# Patient Record
Sex: Male | Born: 1960 | Race: White | Hispanic: No | Marital: Married | Smoking: Current every day smoker
Health system: Southern US, Community
[De-identification: ages and names within clinical notes are randomized; demographics above are authoritative.]

## PROBLEM LIST (undated history)

## (undated) DIAGNOSIS — F419 Anxiety disorder, unspecified: Secondary | ICD-10-CM

## (undated) DIAGNOSIS — F329 Major depressive disorder, single episode, unspecified: Secondary | ICD-10-CM

## (undated) DIAGNOSIS — I10 Essential (primary) hypertension: Secondary | ICD-10-CM

## (undated) DIAGNOSIS — F32A Depression, unspecified: Secondary | ICD-10-CM

## (undated) HISTORY — PX: OTHER SURGICAL HISTORY: SHX169

## (undated) HISTORY — PX: TONSILLECTOMY: SUR1361

## (undated) HISTORY — PX: ABDOMINAL SURGERY: SHX537

## (undated) HISTORY — PX: SHOULDER FUSION SURGERY: SHX775

---

## 2001-03-05 ENCOUNTER — Emergency Department (HOSPITAL_COMMUNITY): Admission: EM | Admit: 2001-03-05 | Discharge: 2001-03-05 | Payer: Self-pay

## 2010-02-02 ENCOUNTER — Inpatient Hospital Stay (HOSPITAL_COMMUNITY): Admission: EM | Admit: 2010-02-02 | Discharge: 2010-02-08 | Payer: Self-pay | Admitting: Emergency Medicine

## 2010-03-15 ENCOUNTER — Encounter: Admission: RE | Admit: 2010-03-15 | Discharge: 2010-03-15 | Payer: Self-pay | Admitting: Surgery

## 2010-05-04 ENCOUNTER — Encounter (INDEPENDENT_AMBULATORY_CARE_PROVIDER_SITE_OTHER): Payer: Self-pay | Admitting: *Deleted

## 2010-05-05 ENCOUNTER — Ambulatory Visit: Payer: Self-pay | Admitting: Internal Medicine

## 2010-05-05 ENCOUNTER — Inpatient Hospital Stay (HOSPITAL_COMMUNITY)
Admission: EM | Admit: 2010-05-05 | Discharge: 2010-05-16 | Payer: Self-pay | Source: Home / Self Care | Admitting: Emergency Medicine

## 2010-05-07 ENCOUNTER — Encounter: Payer: Self-pay | Admitting: Internal Medicine

## 2010-05-27 ENCOUNTER — Encounter: Payer: Self-pay | Admitting: Internal Medicine

## 2010-05-30 ENCOUNTER — Telehealth: Payer: Self-pay | Admitting: Internal Medicine

## 2010-06-07 ENCOUNTER — Telehealth: Payer: Self-pay | Admitting: Internal Medicine

## 2010-06-07 DIAGNOSIS — Z8711 Personal history of peptic ulcer disease: Secondary | ICD-10-CM | POA: Insufficient documentation

## 2010-06-07 DIAGNOSIS — R1013 Epigastric pain: Secondary | ICD-10-CM

## 2010-06-08 ENCOUNTER — Encounter: Payer: Self-pay | Admitting: Internal Medicine

## 2010-06-17 ENCOUNTER — Encounter: Payer: Self-pay | Admitting: Internal Medicine

## 2010-06-17 ENCOUNTER — Ambulatory Visit (HOSPITAL_COMMUNITY)
Admission: RE | Admit: 2010-06-17 | Discharge: 2010-06-17 | Payer: Self-pay | Source: Home / Self Care | Admitting: Internal Medicine

## 2010-06-23 ENCOUNTER — Telehealth: Payer: Self-pay | Admitting: Internal Medicine

## 2010-06-23 ENCOUNTER — Encounter: Payer: Self-pay | Admitting: Internal Medicine

## 2010-06-27 ENCOUNTER — Ambulatory Visit: Payer: Self-pay | Admitting: Cardiology

## 2010-06-27 ENCOUNTER — Telehealth: Payer: Self-pay | Admitting: Internal Medicine

## 2010-07-15 ENCOUNTER — Encounter: Payer: Self-pay | Admitting: Internal Medicine

## 2010-08-10 LAB — CBC
Hemoglobin: 16.8 g/dL (ref 13.0–17.0)
MCV: 89.9 fL (ref 78.0–100.0)
RDW: 12.7 % (ref 11.5–15.5)
WBC: 12.2 10*3/uL — ABNORMAL HIGH (ref 4.0–10.5)

## 2010-08-10 LAB — BASIC METABOLIC PANEL
CO2: 26 mEq/L (ref 19–32)
Chloride: 104 mEq/L (ref 96–112)
Creatinine, Ser: 0.75 mg/dL (ref 0.4–1.5)
GFR calc Af Amer: >60 mL/min (ref >60)

## 2010-08-10 LAB — SURGICAL PCR SCREEN
MRSA, PCR: NEGATIVE
Staphylococcus aureus: NEGATIVE

## 2010-08-12 ENCOUNTER — Inpatient Hospital Stay (HOSPITAL_COMMUNITY)
Admission: RE | Admit: 2010-08-12 | Discharge: 2010-08-13 | Payer: Self-pay | Source: Home / Self Care | Attending: Surgery | Admitting: Surgery

## 2010-08-12 NOTE — Op Note (Signed)
NAME:  Ronald Robertson, Ronald Robertson NO.:  [id number]  MEDICAL RECORD NO.:  [id number]          PATIENT TYPE:  INP  LOCATION:  0001                         FACILITY:  [facility]  PHYSICIAN:  Thornton Park. Daphine Deutscher, MD  DATE OF BIRTH:  May 23, 1961  DATE OF PROCEDURE:  08/12/2010 DATE OF DISCHARGE:                              OPERATIVE REPORT   PREOPERATIVE INDICATIONS:  Ronald Robertson is a 50 year old white male who underwent a laparotomy and Cheree Ditto patch closure of a perforated pyloric channel ulcer on February 03, 2010.  He went home after a 6-day hospital stay and initially did reasonably well, but then began having some worsening abdominal pain with periodic diarrhea, weight loss, and fatigue.  He was admitted in May 07, 2010,  and kept in the hospital for a number of days and an inflammatory mass noted in the midepigastrium in the right upper quadrant.  He continued to hurt, and we did CT.  There was nothing that felt like they could stick, but he had very localized deep pain, so we talked about laparoscopy and possible laparotomy with partial gastrectomy, if need be.  PROCEDURE:  Laparoscopy and takedown of a walled off small intra- abdominal abscess in the right upper quadrant involving the falciform ligament, the omental patch, and the fundus of the gallbladder.  Removal of some silk suture foreign body material which was a part of the abscess and culture of this material.  SURGEON:  Luretha Murphy, M.D.  ASSISTANT:  Ovidio Kin, M.D.  ANESTHESIA:  General endotracheal.  DESCRIPTION OF PROCEDURE:  The patient was taken to room 1 and after time-out, the abdomen which had been prepped with PCMX was entered in the left upper quadrant using 5 mm Optiview 0 degree without difficulty. Abdomen was insufflated.  A second 5 mm was placed in a lower abdomen and through that, I took a pair of the harmonic scalpel and took down adhesions to the anterior abdominal wall.  This freed up  his upper midline incision.  When that was completed, I put another trocar on the right side and began up there where we had marked his epicenter of pain. Medially beneath that, it became apparent that his gallbladder the fundus was drawn down into this abscess cavity which was beneath falciform and involved the omentum.  I went ahead and teased that away and eventually cut that away.  I was able to do that without entering the gallbladder.  The gallbladder itself did not appear to be involved primarily in this and we elected to not remove it, but to leave it in place.  We however kind of entered in this little friable area, and I removed 2 silk sutures that had been used to tie down the omentum in this [place] patch.  Area was cultured and irrigated.  No other things were noted.  The omental patch itself was firmly adherent to the prepyloric region and Dr. Leone Payor had actually done an endoscopy and said that all looked okay on the inside.  We elected to irrigate and then inject the 3 trocar sites with Marcaine and come out.  I think we  had freed him up from the nidus of his pain.  The patient was taken to recovery room in satisfactory condition.  He will be admitted for observation.     Thornton Park Daphine Deutscher, MD     MBM/MEDQ  D:  08/12/2010  T:  08/12/2010  Job:  [id number]  Electronically Signed by Luretha Murphy MD on 08/12/2010 08:32:13 PM

## 2010-08-13 LAB — BASIC METABOLIC PANEL
BUN: 6 mg/dL (ref 6–23)
Calcium: 9.1 mg/dL (ref 8.4–10.5)
Chloride: 106 mEq/L (ref 96–112)
Creatinine, Ser: 0.6 mg/dL (ref 0.4–1.5)
Glucose, Bld: 139 mg/dL — ABNORMAL HIGH (ref 70–99)
Potassium: 4.1 mEq/L (ref 3.5–5.1)
Sodium: 140 mEq/L (ref 135–145)

## 2010-08-13 LAB — CBC
HCT: 42.8 % (ref 39.0–52.0)
MCH: 31.3 pg (ref 26.0–34.0)
MCHC: 35 g/dL (ref 30.0–36.0)
MCV: 89.2 fL (ref 78.0–100.0)
RDW: 12.4 % (ref 11.5–15.5)

## 2010-08-15 LAB — CULTURE, ROUTINE-ABSCESS

## 2010-08-16 NOTE — Letter (Signed)
Summary: New Patient letter  [facility]  [address] [place], [place] [address]   Phone: [phone]  Fax: [phone]       05/04/2010 MRN: [id number]  [facility] [address] [place], [place]  [address]  Dear Ronald Robertson,  Welcome to the Gastroenterology Division at [facility].    You are scheduled to see Dr.  Russella Dar on 06-13-10 at 3:00p.m. on the 3rd floor at [facility], [address].  We ask that you try to arrive at our office 15 minutes prior to your appointment time to allow for check-in.  We would like you to complete the enclosed self-administered evaluation form prior to your visit and bring it with you on the day of your appointment.  We will review it with you.  Also, please bring a complete list of all your medications or, if you prefer, bring the medication bottles and we will list them.  Please bring your insurance card so that we may make a copy of it.  If your insurance requires a referral to see a specialist, please bring your referral form from your primary care physician.  Co-payments are due at the time of your visit and may be paid by cash, check or credit card.     Your office visit will consist of a consult with your physician (includes a physical exam), any laboratory testing he/she may order, scheduling of any necessary diagnostic testing (e.g. x-ray, ultrasound, CT-scan), and scheduling of a procedure (e.g. Endoscopy, Colonoscopy) if required.  Please allow enough time on your schedule to allow for any/all of these possibilities.    If you cannot keep your appointment, please call [phone] to cancel or reschedule prior to your appointment date.  This allows [place] the opportunity to schedule an appointment for another patient in need of care.  If you do not cancel or reschedule by 5 p.m. the business day prior to your appointment date, you will be charged a $50.00 late cancellation/no-show fee.    Thank you for choosing  [facility] for your medical needs.  We appreciate the opportunity to care for you.  Please visit [place] at our website  to learn more about our practice.                     Sincerely,                                                             The Gastroenterology Division

## 2010-08-16 NOTE — Miscellaneous (Signed)
  Clinical Lists Changes  Medications: Added new medication of VICODIN 5-500 MG TABS (HYDROCODONE-ACETAMINOPHEN) 1-2 every 4 hours as needed for pain - Signed Rx of VICODIN 5-500 MG TABS (HYDROCODONE-ACETAMINOPHEN) 1-2 every 4 hours as needed for pain;  #30 x 0;  Signed;  Entered by: Iva Boop MD, Clementeen Graham;  Authorized by: Iva Boop MD, FACG;  Method used: Print then Give to Patient    Prescriptions: VICODIN 5-500 MG TABS (HYDROCODONE-ACETAMINOPHEN) 1-2 every 4 hours as needed for pain  #30 x 0   Entered and Authorized by:   Iva Boop MD, [facility]   Signed by:   Iva Boop MD, [facility] on 06/17/2010   Method used:   Print then Give to Patient   RxID:   [id number]

## 2010-08-16 NOTE — Progress Notes (Signed)
Summary: Triage  Phone Note Call from Patient Call back at Home Phone [phone]   Caller: Patient Call For: Dr. Leone Payor Reason for Call: Talk to Nurse Summary of Call: would like to know if Dr. Leone Payor has received dictation from Dr. Daphine Deutscher and what the next step is Initial call taken by: Karna Christmas,  June 07, 2010 4:40 PM  Follow-up for Phone Call        Dr Leone Payor, have you seen this? Follow-up by: Darcey Nora RN, CGRN,  June 07, 2010 4:49 PM  Additional Follow-up for Phone Call Additional follow up Details #1::        I have not. Has he asked Dr. Daphine Deutscher about it? We can call over (you) and leave a message with Dr. Ermalene Searing nurse or CMA about this. If he needs an EGD we can set it up - probably next week at hospital. Additional Follow-up by: Iva Boop MD, Clementeen Graham,  June 07, 2010 5:12 PM  New Problems: EPIGASTRIC PAIN (ICD-789.06) PEPTIC ULCER DISEASE, HX OF (ICD-V12.71)   Additional Follow-up for Phone Call Additional follow up Details #2::    Left message to call back to discuss EGD scheduled for 06/17/10 8:30 at [facility].  I have contacted Dr Ermalene Searing office and they are requesting EGD for abdominal pain and to confirm healing of gastric ulcer. Darcey Nora, RN CGRN Follow-up by: Selinda Michaels RN,  June 08, 2010 10:25 AM  Additional Follow-up for Phone Call Additional follow up Details #3:: Details for Additional Follow-up Action Taken: Patient is advised of appt at [facility] next week.  Patient is advised to be NPO after midnight.  I will send him EGD instructions in the mail.  Patient states he has no insurance to precert Additional Follow-up by: Selinda Michaels RN,  June 08, 2010 1:49 PM  New Problems: EPIGASTRIC PAIN (ICD-789.06) PEPTIC ULCER DISEASE, HX OF (ICD-V12.71)

## 2010-08-16 NOTE — Letter (Signed)
Summary: [facility]   Imported By: Sherian Rein 06/15/2010 11:13:51  _____________________________________________________________________  External Attachment:    Type:   Image     Comment:   External Document

## 2010-08-16 NOTE — Procedures (Signed)
Summary: Upper Endoscopy  Patient: Ronald Robertson Note: All result statuses are Final unless otherwise noted.  Tests: (1) Upper Endoscopy (EGD)   EGD Upper Endoscopy       DONE (C)     [facility]     [address] [place], [place]  [address]           ENDOSCOPY PROCEDURE REPORT           PATIENT:  Ronald, Robertson  MR#:  [id number]     BIRTHDATE:  [birth year], 49 yrs. old  GENDER:  male           ENDOSCOPIST:  Iva Boop, MD, [facility]     Referred by:  Luretha Murphy, M.D.           PROCEDURE DATE:  06/17/2010     PROCEDURE:  EGD, diagnostic 43235     ASA CLASS:  Class I     INDICATIONS:  follow-up of gastric ulcer, abdominal pain     Perforated pyloric channel ulcer closed in July 2011, then leaking     at site found Oct 2011, with intraabdominal abscess           MEDICATIONS:   Fentanyl 75 mcg, Versed 7.5 mg ADDENDUM: Benadryl     25 mg IV     TOPICAL ANESTHETIC:  Cetacaine Spray           DESCRIPTION OF PROCEDURE:   After the risks benefits and     alternatives of the procedure were thoroughly explained, informed     consent was obtained.  The Pentax Gastroscope [id number] endoscope     was introduced through the mouth and advanced to the second     portion of the duodenum, without limitations.  The instrument was     slowly withdrawn as the mucosa was fully examined.     <<PROCEDUREIMAGES>>           Post-operative change was noted in the antrum. Suture at oversew     site, closed oversew site.  Otherwise the examination was normal.     Retroflexed views revealed no abnormalities.    The scope was then     withdrawn from the patient and the procedure completed.           COMPLICATIONS:  None           ENDOSCOPIC IMPRESSION:     1) Post-operative change in the antrum - closed oversew site     2) Otherwise normal examination - I think pain in abdomen due to     resolving inflammatory process from delayed oversew leak.     RECOMMENDATIONS:     He should  continue a PPI daily for 1 year, at least.     Repeat CT abd/pelvis will be scheduled for 2 weeks.     I have prescribed hydrocodone 5mg  APAP 500 mg 1-2 every 4 hours     as needed #30 today.           REPEAT EXAM:  In for routine screening colonoscopy. age 84,     October 2014           Iva Boop, MD, [facility]           CC:  Luretha Murphy, MD     The Patient           n.     REVISED:  06/17/2010 09:39 AM     eSIGNED:  Iva Boop at 06/17/2010 09:39 AM           Ronald Robertson, [id number]  Note: An exclamation mark (!) indicates a result that was not dispersed into the flowsheet. Document Creation Date: 06/17/2010 9:39 AM _______________________________________________________________________  (1) Order result status: Final Collection or observation date-time: 06/17/2010 09:09 Requested date-time:  Receipt date-time:  Reported date-time:  Referring Physician:   Ordering Physician: Stan Head [id number]) Specimen Source:  Source: Launa Grill Order Number: [id number] Lab site:   Appended Document: Upper Endoscopy-REFILL VICODIN CT scheduled for 07/01/10 at 10 am.  Pt will come by today to pick up contrast.  Pt is also requesting refill on vicodin.  Is this OK?  Fax to [facility] or give to patient when he comes for contrast. Francee Piccolo CMA Duncan Dull)  June 20, 2010 3:35 PM   No am not refilling this, he can ask Dr. Daphine Deutscher I would have thought this would have lasted longer Iva Boop MD, [facility]  June 21, 2010 8:26 AM  Pt is notified of above.  He will call Dr. Daphine Deutscher to see if he will refill. Francee Piccolo CMA Duncan Dull)  June 21, 2010 9:04 AM

## 2010-08-16 NOTE — Progress Notes (Signed)
Summary: move up CT  Phone Note From Other Clinic   Caller: Dr, Ezzard Standing Summary of Call: Patient in office with persistent abdominal pain - he is due to see Dr, Daphine Deutscher in 1 week but is in their work-in clinic Dr. Ezzard Standing will Rx more pain medication and we have decided to move up the CT with iv and oral contrast  to tomorrow or Monday/ Tuesday of next week. Please contact the patient in the morning to arrange this change in CT date and use other CT location if needed  Initial call taken by: Iva Boop MD, Clementeen Graham,  June 23, 2010 3:38 PM  Follow-up for Phone Call        CT scan is rescheduled for 06/27/10 9:30.  Patient aware Follow-up by: Darcey Nora RN, CGRN,  June 24, 2010 10:27 AM

## 2010-08-16 NOTE — Procedures (Signed)
Summary: Upper Endoscopy  Patient: Ronald Robertson Note: All result statuses are Final unless otherwise noted.  Tests: (1) Upper Endoscopy (EGD)   EGD Upper Endoscopy       DONE     [facility]     [address] [place], [place]  [address]           ENDOSCOPY PROCEDURE REPORT           PATIENT:  Paras, Kreider  MR#:  [id number]     BIRTHDATE:  14-Oct-1960, 49 yrs. old  GENDER:  male           ENDOSCOPIST:  Iva Boop, MD, [facility]     Referred by:  Claud Kelp, M.D.           PROCEDURE DATE:  05/07/2010     PROCEDURE:  EGD with biopsy for H. pylori 29562     ASA CLASS:  Class II     INDICATIONS:  nausea and vomiting, weight loss, abnormal imaging,     diarrhea s/p oversew of perforated pyloric channel ulcer 01/2010     now with abdominal pain and other sxs, CT shows inflammatory     changes in RUQ anterior to stomach and tracking into RUQ area with     colonic wall thickening also           MEDICATIONS:   Fentanyl 100 mcg, Versed 9 mg     TOPICAL ANESTHETIC:  Cetacaine Spray           DESCRIPTION OF PROCEDURE:   After the risks benefits and     alternatives of the procedure were thoroughly explained, informed     consent was obtained.  The  endoscope was introduced through the     mouth and advanced to the second portion of the duodenum, without     limitations.  The instrument was slowly withdrawn as the mucosa     was fully examined.     <<PROCEDUREIMAGES>>           Post-operative change was noted. Milky fluid overlying the oversew     site in anterior distal stomach, looks like distal antrum. This     was suctioned and washed revealing a funnel-like area with suture     at base. Could not see any defects.  Multiple ulcers were found in     the bulb and descending duodenum. Superficial to slightly deep,     not larger than 7-8 mm in size and no bleeding stigmata.     Otherwise the examination was normal. A biopsy for H. pylori was     taken.     Retroflexed views revealed no abnormalities.    The     scope was then withdrawn from the patient and the procedure     completed.           COMPLICATIONS:  None           ENDOSCOPIC IMPRESSION:     1) Post-operative change in the anterior distal antrum with     oversew site originally concealed by milky (? mucopurulent) fluid           2) Ulcers, multiple in the bulb/descending duodenum     3) Otherwise normal examination     RECOMMENDATIONS:     1) Await CLO test     2) Will discuss next step - I remain of the opinion that colon     is  not primarily involved. Think post-operative problem likely     with inflammatory process in RUQ extending to colon. There appears     to be a loop of small bowel somewhat compressed by this as well.     3) PPI     4) Gastrin level           REPEAT EXAM:  as needed           Iva Boop, MD, Clementeen Graham           CC:  Luretha Murphy, MD           n.     eSIGNED:   Iva Boop at 05/07/2010 06:01 PM           Thurmon Fair, [id number]  Note: An exclamation mark (!) indicates a result that was not dispersed into the flowsheet. Document Creation Date: 05/07/2010 6:01 PM _______________________________________________________________________  (1) Order result status: Final Collection or observation date-time: 05/07/2010 17:43 Requested date-time:  Receipt date-time:  Reported date-time:  Referring Physician:   Ordering Physician: Stan Head [phone]) Specimen Source:  Source: Launa Grill Order Number: [id number] Lab site:

## 2010-08-16 NOTE — Letter (Signed)
Summary: EGD Instructions  [facility]  [address] [place], [place] [address]   Phone: [phone]  Fax: [phone]       Ronald Robertson    1961/06/10    MRN: [id number]       Procedure Day /Date: Friday 06/17/10     Arrival Time: 0730am     Procedure Time:0830am     Location of Procedure:                     _  X_ [facility] ( Outpatient Registration)   PREPARATION FOR ENDOSCOPY   On 06/17/10 THE DAY OF THE PROCEDURE:  1.   No solid foods, milk or milk products are allowed after midnight the night before your procedure.  2.   Do not drink anything colored red or purple.  Avoid juices with pulp.  No orange juice.  3.  You may drink clear liquids until 0430am, which is 2 hours before your procedure.                                                                                                CLEAR LIQUIDS INCLUDE: Water Jello Ice Popsicles Tea (sugar ok, no milk/cream) Powdered fruit flavored drinks Coffee (sugar ok, no milk/cream) Gatorade Juice: apple, white grape, white cranberry  Lemonade Clear bullion, consomm, broth Carbonated beverages (any kind) Strained chicken noodle soup Hard Candy   MEDICATION INSTRUCTIONS  Unless otherwise instructed, you should take regular prescription medications with a small sip of water as early as possible the morning of your procedure.  Diabetic patients - see separate instructions.         Additional medication instructions: _             OTHER INSTRUCTIONS  You will need a responsible adult at least 48 years of age to accompany you and drive you home.   This person must remain in the waiting room during your procedure.  Wear loose fitting clothing that is easily removed.  Leave jewelry and other valuables at home.  However, you may wish to bring a book to read or an iPod/MP3 player to listen to music as you wait for your procedure to start.  Remove all body piercing jewelry and  leave at home.  Total time from sign-in until discharge is approximately 2-3 hours.  You should go home directly after your procedure and rest.  You can resume normal activities the day after your procedure.  The day of your procedure you should not:   Drive   Make legal decisions   Operate machinery   Drink alcohol   Return to work  You will receive specific instructions about eating, activities and medications before you leave.    The above instructions have been reviewed and explained to me by   _______________________    I fully understand and can verbalize these instructions _____________________________ Date _________     Appended Document: EGD Instructions Letter is mailed to the patient's home address

## 2010-08-16 NOTE — Progress Notes (Signed)
Summary: f/u   Phone Note Call from Patient Call back at [phone]   Caller: Patient Call For: Dr. Leone Payor Reason for Call: Talk to Nurse Summary of Call: would like to know if Dr. Leone Payor has received dictation from Dr. Daphine Deutscher and what the next step is Initial call taken by: Vallarie Mare,  May 30, 2010 1:11 PM  Follow-up for Phone Call        Left message for patient to call back Darcey Nora RN, [facility]  May 30, 2010 2:02 PM  Ronald Robertson stopped by.  He was seen by [facility] last Friday.  They indicated to him he needed a ? EGD and told him they would send a note to Dr Leone Payor.  Dictation is not yet ready.  Probaly will come on Tues or Wed.  I have advised the patient that once Dr Leone Payor has reviewed the note from Dr Daphine Deutscher we will let him know Dr Marvell Fuller recommendations.   Patient agrees with this plan Follow-up by: Darcey Nora RN, CGRN,  May 30, 2010 4:29 PM

## 2010-08-18 NOTE — Letter (Signed)
Summary: [facility]   Imported By: Lennie Odor 08/04/2010 11:19:40  _____________________________________________________________________  External Attachment:    Type:   Image     Comment:   External Document

## 2010-08-18 NOTE — Progress Notes (Signed)
Summary: CT results  ---- Converted from flag ---- ---- 06/27/2010 10:31 AM, Karna Christmas wrote: Dr. Charlett Nose would like to discuss this mutual pt.  #[phone] ------------------------------  Phone Note Outgoing Call   Summary of Call: His CT still shows inflammation and presumed infection - we will forward a copy to Dr. Wenda Low (please do) Is he on any antibiotics at this time? If not Rx generic Augmentin 875 two times a day x 10 days It is my understanding he is seeing Dr. Daphine Deutscher this week - please confirm Iva Boop MD, [facility]  June 27, 2010 1:48 PM   Follow-up for Phone Call        Left message for patient to call back. Appointment with Dr Daphine Deutscher for this Friday per Dr Jacinto Halim office. Darcey Nora RN, [facility]  June 27, 2010 2:10 PM  Patient  currently is on PCN combo drug 500 mg for 10 days.  He is requesting something for pain.  Dr Leone Payor please advise Follow-up by: Darcey Nora RN, CGRN,  June 28, 2010 12:02 PM  Additional Follow-up for Phone Call Additional follow up Details #1::        call Dr. Ermalene Searing office for pain rx please I have reveiewed CT scan and plans with Dr. Daphine Deutscher (yesterday) I think my involvement in his care now is over unless Dr. Daphine Deutscher asks me for more help. Iva Boop MD, [facility]  June 28, 2010 1:50 PM     Additional Follow-up for Phone Call Additional follow up Details #2::    Patient advised of Dr Marvell Fuller recommendations and response. Follow-up by: Darcey Nora RN, CGRN,  June 28, 2010 2:12 PM

## 2010-08-18 NOTE — Letter (Signed)
Summary: [facility]   Imported By: Lester Rio Hondo 07/05/2010 11:55:01  _____________________________________________________________________  External Attachment:    Type:   Image     Comment:   External Document

## 2010-08-25 NOTE — Discharge Summary (Signed)
  NAMEMarland Kitchen  TERRIE, Ronald NO.:  Robertson  MEDICAL RECORD NO.:  [id number]          PATIENT TYPE:  INP  LOCATION:  1535                         FACILITY:  [facility]  PHYSICIAN:  Thornton Park. Daphine Deutscher, MD  DATE OF BIRTH:  13-Jan-1961  DATE OF ADMISSION:  08/12/2010 DATE OF DISCHARGE:  08/13/2010                              DISCHARGE SUMMARY   ADMITTING DIAGNOSIS:  Chronic right upper quadrant pain after perforated pyloric channel ulcer in July 2011.  DISCHARGE DIAGNOSIS:  Chronic abscess in right upper quadrant involving gallbladder, omentum with some suture material which was removed and takedown - surgically resolved.  PROCEDURES:  Laparoscopy with takedown of chronic inflammatory process (phlegmon).  COURSE IN HOSPITAL:  Ronald Robertson came in on Friday, August 12, 2010, and underwent the laparoscopic procedure to take down adhesions and also explore the source of his right upper quadrant pain.  This was found to be an area where his omental patch was stuck down but there was some residual small inflammatory material around his silk suture but more importantly this seem to involve the wall of his gallbladder which I think was probably producing some of his pain.  This was all taken down and foreign bodies near the sutures removed and areas were cultured. There was no gross pus. It all seem to be more chronic inflammatory processes.  The patient has done well and seeks to go home on postop day #1.  He is doing well.  We discussed his oxycodone apparent dependence and his reliance on this preop treating his pain.  We talked, had a little verbal contract to try that and I will give him oxycodone 15 mg #60 to take one every 6 hours for pain and we will try to wean him off that in the ensuing few weeks.  His condition is improved.  His incisions looked good, and followup in the office in 2 weeks.     Thornton Park Daphine Deutscher, MD     MBM/MEDQ  D:  08/13/2010  T:   08/13/2010  Job:  [id number]  Electronically Signed by Luretha Murphy MD on 08/25/2010 06:57:41 AM

## 2010-09-01 ENCOUNTER — Encounter: Payer: Self-pay | Admitting: Internal Medicine

## 2010-09-28 LAB — GLUCOSE, CAPILLARY
Glucose-Capillary: 113 mg/dL — ABNORMAL HIGH (ref 70–99)
Glucose-Capillary: 116 mg/dL — ABNORMAL HIGH (ref 70–99)
Glucose-Capillary: 117 mg/dL — ABNORMAL HIGH (ref 70–99)
Glucose-Capillary: 117 mg/dL — ABNORMAL HIGH (ref 70–99)
Glucose-Capillary: 119 mg/dL — ABNORMAL HIGH (ref 70–99)
Glucose-Capillary: 119 mg/dL — ABNORMAL HIGH (ref 70–99)
Glucose-Capillary: 119 mg/dL — ABNORMAL HIGH (ref 70–99)
Glucose-Capillary: 121 mg/dL — ABNORMAL HIGH (ref 70–99)
Glucose-Capillary: 122 mg/dL — ABNORMAL HIGH (ref 70–99)
Glucose-Capillary: 123 mg/dL — ABNORMAL HIGH (ref 70–99)
Glucose-Capillary: 124 mg/dL — ABNORMAL HIGH (ref 70–99)
Glucose-Capillary: 129 mg/dL — ABNORMAL HIGH (ref 70–99)
Glucose-Capillary: 129 mg/dL — ABNORMAL HIGH (ref 70–99)
Glucose-Capillary: 133 mg/dL — ABNORMAL HIGH (ref 70–99)
Glucose-Capillary: 133 mg/dL — ABNORMAL HIGH (ref 70–99)
Glucose-Capillary: 135 mg/dL — ABNORMAL HIGH (ref 70–99)
Glucose-Capillary: 135 mg/dL — ABNORMAL HIGH (ref 70–99)
Glucose-Capillary: 142 mg/dL — ABNORMAL HIGH (ref 70–99)
Glucose-Capillary: 167 mg/dL — ABNORMAL HIGH (ref 70–99)
Glucose-Capillary: 94 mg/dL (ref 70–99)
Glucose-Capillary: 95 mg/dL (ref 70–99)
Glucose-Capillary: 95 mg/dL (ref 70–99)

## 2010-09-28 LAB — COMPREHENSIVE METABOLIC PANEL
ALT: 14 U/L (ref 0–53)
AST: 11 U/L (ref 0–37)
AST: 18 U/L (ref 0–37)
Albumin: 2.5 g/dL — ABNORMAL LOW (ref 3.5–5.2)
Albumin: 2.5 g/dL — ABNORMAL LOW (ref 3.5–5.2)
Albumin: 2.7 g/dL — ABNORMAL LOW (ref 3.5–5.2)
Alkaline Phosphatase: 101 U/L (ref 39–117)
Alkaline Phosphatase: 81 U/L (ref 39–117)
Alkaline Phosphatase: 81 U/L (ref 39–117)
Alkaline Phosphatase: 82 U/L (ref 39–117)
BUN: 5 mg/dL — ABNORMAL LOW (ref 6–23)
BUN: 6 mg/dL (ref 6–23)
BUN: 7 mg/dL (ref 6–23)
BUN: 8 mg/dL (ref 6–23)
CO2: 26 mEq/L (ref 19–32)
CO2: 26 mEq/L (ref 19–32)
Calcium: 8.6 mg/dL (ref 8.4–10.5)
Calcium: 9.2 mg/dL (ref 8.4–10.5)
Calcium: 9.3 mg/dL (ref 8.4–10.5)
Chloride: 105 mEq/L (ref 96–112)
Chloride: 106 mEq/L (ref 96–112)
Chloride: 107 mEq/L (ref 96–112)
Creatinine, Ser: 0.62 mg/dL (ref 0.4–1.5)
Creatinine, Ser: 0.8 mg/dL (ref 0.4–1.5)
GFR calc Af Amer: >60 mL/min (ref >60)
GFR calc Af Amer: >60 mL/min (ref >60)
GFR calc Af Amer: >60 mL/min (ref >60)
GFR calc non Af Amer: >60 mL/min (ref >60)
GFR calc non Af Amer: >60 mL/min (ref >60)
Glucose, Bld: 120 mg/dL — ABNORMAL HIGH (ref 70–99)
Glucose, Bld: 141 mg/dL — ABNORMAL HIGH (ref 70–99)
Potassium: 3.3 mEq/L — ABNORMAL LOW (ref 3.5–5.1)
Potassium: 3.7 mEq/L (ref 3.5–5.1)
Potassium: 3.7 mEq/L (ref 3.5–5.1)
Potassium: 3.9 mEq/L (ref 3.5–5.1)
Potassium: 4 mEq/L (ref 3.5–5.1)
Sodium: 138 mEq/L (ref 135–145)
Sodium: 139 mEq/L (ref 135–145)
Sodium: 141 mEq/L (ref 135–145)
Sodium: 141 mEq/L (ref 135–145)
Total Bilirubin: 0.5 mg/dL (ref 0.3–1.2)
Total Bilirubin: 0.5 mg/dL (ref 0.3–1.2)
Total Bilirubin: 0.5 mg/dL (ref 0.3–1.2)
Total Bilirubin: 0.7 mg/dL (ref 0.3–1.2)
Total Protein: 5.8 g/dL — ABNORMAL LOW (ref 6.0–8.3)
Total Protein: 6 g/dL (ref 6.0–8.3)
Total Protein: 6.5 g/dL (ref 6.0–8.3)

## 2010-09-28 LAB — CBC
HCT: 36.2 % — ABNORMAL LOW (ref 39.0–52.0)
HCT: 39.2 % (ref 39.0–52.0)
HCT: 39.7 % (ref 39.0–52.0)
HCT: 44.2 % (ref 39.0–52.0)
Hemoglobin: 13.4 g/dL (ref 13.0–17.0)
Hemoglobin: 13.8 g/dL (ref 13.0–17.0)
MCH: 29.9 pg (ref 26.0–34.0)
MCH: 30.2 pg (ref 26.0–34.0)
MCH: 30.2 pg (ref 26.0–34.0)
MCHC: 34.3 g/dL (ref 30.0–36.0)
MCHC: 34.4 g/dL (ref 30.0–36.0)
MCHC: 34.5 g/dL (ref 30.0–36.0)
MCV: 87 fL (ref 78.0–100.0)
MCV: 87.3 fL (ref 78.0–100.0)
MCV: 87.4 fL (ref 78.0–100.0)
MCV: 87.9 fL (ref 78.0–100.0)
Platelets: 335 10*3/uL (ref 150–400)
Platelets: 367 10*3/uL (ref 150–400)
Platelets: 369 10*3/uL (ref 150–400)
Platelets: 379 10*3/uL (ref 150–400)
Platelets: 408 10*3/uL — ABNORMAL HIGH (ref 150–400)
Platelets: 412 10*3/uL — ABNORMAL HIGH (ref 150–400)
RBC: 4.15 MIL/uL — ABNORMAL LOW (ref 4.22–5.81)
RBC: 4.55 MIL/uL (ref 4.22–5.81)
RBC: 4.64 MIL/uL (ref 4.22–5.81)
RBC: 4.67 MIL/uL (ref 4.22–5.81)
RBC: 4.73 MIL/uL (ref 4.22–5.81)
RDW: 15.5 % (ref 11.5–15.5)
RDW: 15.8 % — ABNORMAL HIGH (ref 11.5–15.5)
RDW: 15.9 % — ABNORMAL HIGH (ref 11.5–15.5)
WBC: 10.5 10*3/uL (ref 4.0–10.5)
WBC: 10.7 10*3/uL — ABNORMAL HIGH (ref 4.0–10.5)
WBC: 11.4 10*3/uL — ABNORMAL HIGH (ref 4.0–10.5)
WBC: 12.1 10*3/uL — ABNORMAL HIGH (ref 4.0–10.5)
WBC: 9.6 10*3/uL (ref 4.0–10.5)

## 2010-09-28 LAB — BASIC METABOLIC PANEL
BUN: 4 mg/dL — ABNORMAL LOW (ref 6–23)
BUN: 9 mg/dL (ref 6–23)
CO2: 28 mEq/L (ref 19–32)
Calcium: 8.3 mg/dL — ABNORMAL LOW (ref 8.4–10.5)
Calcium: 9.3 mg/dL (ref 8.4–10.5)
Chloride: 103 mEq/L (ref 96–112)
Chloride: 105 mEq/L (ref 96–112)
Chloride: 107 mEq/L (ref 96–112)
Creatinine, Ser: 0.58 mg/dL (ref 0.4–1.5)
Creatinine, Ser: 0.72 mg/dL (ref 0.4–1.5)
GFR calc Af Amer: >60 mL/min (ref >60)
GFR calc Af Amer: >60 mL/min (ref >60)
GFR calc non Af Amer: >60 mL/min (ref >60)
GFR calc non Af Amer: >60 mL/min (ref >60)
Glucose, Bld: 115 mg/dL — ABNORMAL HIGH (ref 70–99)
Glucose, Bld: 122 mg/dL — ABNORMAL HIGH (ref 70–99)
Potassium: 4.4 mEq/L (ref 3.5–5.1)
Sodium: 138 mEq/L (ref 135–145)

## 2010-09-28 LAB — DIFFERENTIAL
Basophils Absolute: 0.1 10*3/uL (ref 0.0–0.1)
Basophils Absolute: 0.1 10*3/uL (ref 0.0–0.1)
Basophils Relative: 1 % (ref 0–1)
Basophils Relative: 1 % (ref 0–1)
Eosinophils Absolute: 0.3 10*3/uL (ref 0.0–0.7)
Eosinophils Relative: 3 % (ref 0–5)
Lymphocytes Relative: 12 % (ref 12–46)
Lymphocytes Relative: 15 % (ref 12–46)
Lymphocytes Relative: 15 % (ref 12–46)
Lymphocytes Relative: 16 % (ref 12–46)
Lymphs Abs: 1.7 10*3/uL (ref 0.7–4.0)
Lymphs Abs: 1.8 10*3/uL (ref 0.7–4.0)
Lymphs Abs: 3 10*3/uL (ref 0.7–4.0)
Monocytes Absolute: 0.6 10*3/uL (ref 0.1–1.0)
Monocytes Absolute: 0.7 10*3/uL (ref 0.1–1.0)
Monocytes Absolute: 0.7 10*3/uL (ref 0.1–1.0)
Monocytes Absolute: 0.8 10*3/uL (ref 0.1–1.0)
Monocytes Relative: 6 % (ref 3–12)
Monocytes Relative: 6 % (ref 3–12)
Monocytes Relative: 7 % (ref 3–12)
Neutro Abs: 7.8 10*3/uL — ABNORMAL HIGH (ref 1.7–7.7)
Neutro Abs: 8.4 10*3/uL — ABNORMAL HIGH (ref 1.7–7.7)
Neutro Abs: 8.7 10*3/uL — ABNORMAL HIGH (ref 1.7–7.7)
Neutro Abs: 9.3 10*3/uL — ABNORMAL HIGH (ref 1.7–7.7)
Neutrophils Relative %: 76 % (ref 43–77)
Neutrophils Relative %: 77 % (ref 43–77)

## 2010-09-28 LAB — POTASSIUM: Potassium: 3.4 mEq/L — ABNORMAL LOW (ref 3.5–5.1)

## 2010-09-28 LAB — PREALBUMIN
Prealbumin: 15.4 mg/dL — ABNORMAL LOW (ref 18.0–45.0)
Prealbumin: 17 mg/dL — ABNORMAL LOW (ref 18.0–45.0)

## 2010-09-28 LAB — MAGNESIUM
Magnesium: 2 mg/dL (ref 1.5–2.5)
Magnesium: 2.3 mg/dL (ref 1.5–2.5)
Magnesium: 2.6 mg/dL — ABNORMAL HIGH (ref 1.5–2.5)

## 2010-09-28 LAB — URINALYSIS, ROUTINE W REFLEX MICROSCOPIC
Hgb urine dipstick: NEGATIVE
Nitrite: NEGATIVE
Protein, ur: NEGATIVE mg/dL
Urobilinogen, UA: 1 mg/dL (ref 0.0–1.0)
pH: 7.5 (ref 5.0–8.0)

## 2010-09-28 LAB — CLOSTRIDIUM DIFFICILE EIA

## 2010-09-28 LAB — CREATININE, SERUM
GFR calc Af Amer: >60 mL/min (ref >60)
GFR calc non Af Amer: >60 mL/min (ref >60)

## 2010-09-28 LAB — TRIGLYCERIDES
Triglycerides: 112 mg/dL (ref <150)
Triglycerides: 141 mg/dL (ref <150)

## 2010-09-28 LAB — GASTRIN: Gastrin: 59

## 2010-09-28 LAB — PHOSPHORUS
Phosphorus: 3.8 mg/dL (ref 2.3–4.6)
Phosphorus: 3.9 mg/dL (ref 2.3–4.6)
Phosphorus: 4 mg/dL (ref 2.3–4.6)

## 2010-09-28 LAB — CHOLESTEROL, TOTAL
Cholesterol: 104 mg/dL (ref 0–200)
Cholesterol: 114 mg/dL (ref 0–200)

## 2010-10-01 LAB — COMPREHENSIVE METABOLIC PANEL
ALT: 21 U/L (ref 0–53)
Alkaline Phosphatase: 50 U/L (ref 39–117)
CO2: 25 mEq/L (ref 19–32)
GFR calc non Af Amer: >60 mL/min (ref >60)
Glucose, Bld: 131 mg/dL — ABNORMAL HIGH (ref 70–99)
Potassium: 4 mEq/L (ref 3.5–5.1)
Sodium: 139 mEq/L (ref 135–145)
Total Bilirubin: 1.2 mg/dL (ref 0.3–1.2)

## 2010-10-01 LAB — CBC
HCT: 52.5 % — ABNORMAL HIGH (ref 39.0–52.0)
Hemoglobin: 18.4 g/dL — ABNORMAL HIGH (ref 13.0–17.0)
WBC: 13 10*3/uL — ABNORMAL HIGH (ref 4.0–10.5)

## 2010-10-01 LAB — URINE MICROSCOPIC-ADD ON

## 2010-10-01 LAB — URINALYSIS, ROUTINE W REFLEX MICROSCOPIC
Hgb urine dipstick: NEGATIVE
Protein, ur: 30 mg/dL — AB
Urobilinogen, UA: 1 mg/dL (ref 0.0–1.0)

## 2010-10-01 LAB — MRSA PCR SCREENING: MRSA by PCR: NEGATIVE

## 2010-10-01 LAB — DIFFERENTIAL
Basophils Absolute: 0 10*3/uL (ref 0.0–0.1)
Eosinophils Relative: 1 % (ref 0–5)
Lymphs Abs: 1.2 10*3/uL (ref 0.7–4.0)
Monocytes Absolute: 0.5 10*3/uL (ref 0.1–1.0)

## 2010-10-01 LAB — PH, BODY FLUID: pH, Fluid: 4.5

## 2010-10-01 LAB — CK TOTAL AND CKMB (NOT AT ARMC): Relative Index: 2.5 (ref 0.0–2.5)

## 2010-10-04 NOTE — Letter (Signed)
Summary: [facility]   Imported By: Sherian Rein 09/29/2010 13:42:48  _____________________________________________________________________  External Attachment:    Type:   Image     Comment:   External Document

## 2011-08-21 ENCOUNTER — Emergency Department (HOSPITAL_COMMUNITY): Payer: BC Managed Care – PPO

## 2011-08-21 ENCOUNTER — Emergency Department (HOSPITAL_COMMUNITY)
Admission: EM | Admit: 2011-08-21 | Discharge: 2011-08-21 | Disposition: A | Discharge status: Home / Self Care | Payer: BC Managed Care – PPO | Attending: Emergency Medicine | Admitting: Emergency Medicine

## 2011-08-21 DIAGNOSIS — S022XXA Fracture of nasal bones, initial encounter for closed fracture: Secondary | ICD-10-CM | POA: Insufficient documentation

## 2011-08-21 DIAGNOSIS — R51 Headache: Secondary | ICD-10-CM | POA: Insufficient documentation

## 2011-08-21 DIAGNOSIS — R040 Epistaxis: Secondary | ICD-10-CM | POA: Insufficient documentation

## 2011-08-21 DIAGNOSIS — R1013 Epigastric pain: Secondary | ICD-10-CM | POA: Insufficient documentation

## 2011-08-21 LAB — DIFFERENTIAL
Eosinophils Absolute: 0.3 10*3/uL (ref 0.0–0.7)
Eosinophils Relative: 3 % (ref 0–5)
Lymphs Abs: 2.1 10*3/uL (ref 0.7–4.0)
Monocytes Absolute: 0.9 10*3/uL (ref 0.1–1.0)
Monocytes Relative: 9 % (ref 3–12)

## 2011-08-21 LAB — BASIC METABOLIC PANEL
BUN: 7 mg/dL (ref 6–23)
Calcium: 9.5 mg/dL (ref 8.4–10.5)
Creatinine, Ser: 0.64 mg/dL (ref 0.50–1.35)
GFR calc non Af Amer: >90 mL/min (ref >90)
Glucose, Bld: 102 mg/dL — ABNORMAL HIGH (ref 70–99)
Sodium: 140 mEq/L (ref 135–145)

## 2011-08-21 LAB — CBC
MCH: 32 pg (ref 26.0–34.0)
MCV: 90.1 fL (ref 78.0–100.0)
Platelets: 204 10*3/uL (ref 150–400)
RBC: 4.65 MIL/uL (ref 4.22–5.81)

## 2011-08-21 MED ORDER — IOHEXOL 300 MG/ML  SOLN
100.0000 mL | Freq: Once | INTRAMUSCULAR | Status: AC | PRN
  Administered 2011-08-21: 100 mL via INTRAVENOUS

## 2011-08-21 MED ORDER — SODIUM CHLORIDE 0.9 % IV SOLN
INTRAVENOUS | Status: DC
Start: 2011-08-21 — End: 2011-08-21
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
## 2011-08-21 NOTE — ED Notes (Signed)
Pt confused, disoriented, not able to follow commands in CT. Pt actions impulsive and with out co-ordination.
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
## 2011-10-25 IMAGING — CT CT ABD-PELV W/ CM
2 of 5 series · 16 of 46 positions shown, 18 images · IV contrast (Omnipaque 300)
Comparison: 05/13/2010

CLINICAL DATA: Abdominal pain, right upper quadrant pain.  Gastric
ulcer.  Prior surgery.

CT ABDOMEN AND PELVIS WITH CONTRAST
TECHNIQUE: Multidetector CT imaging of the abdomen and pelvis was
performed following the standard protocol during bolus
administration of intravenous contrast.
Contrast: 100 ml Omnipaque 300 IV.

[Series 2: abd/ pel 5mm · axial · 0.79mm/px · z∈[-482,-38]mm · 13 of 101 slices shown, 15 images]
[im 6/101  soft-tissue]
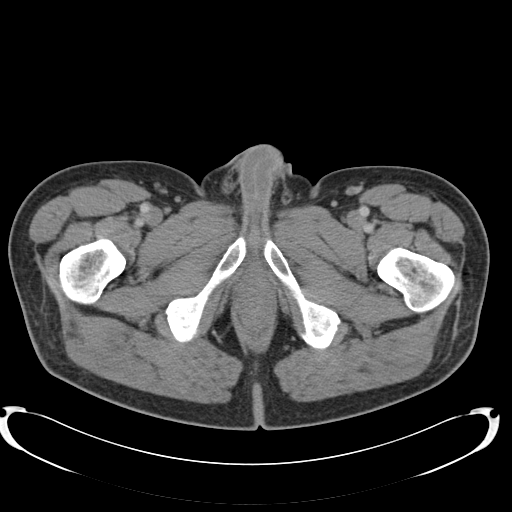
[im 6/101  bone]
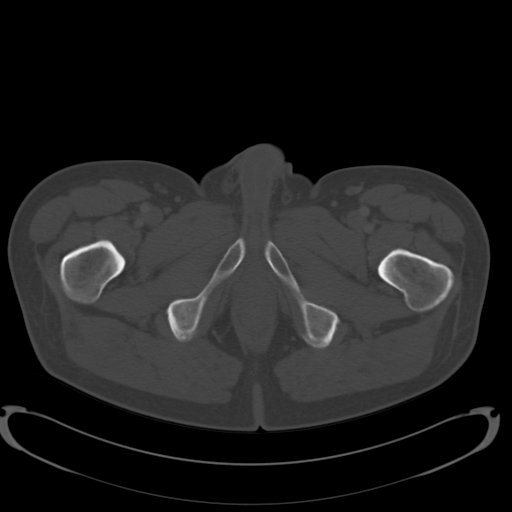
[im 16/101  soft-tissue]
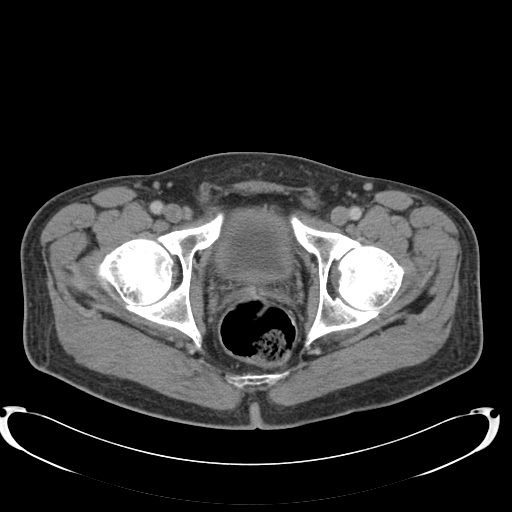
[im 22/101  soft-tissue]
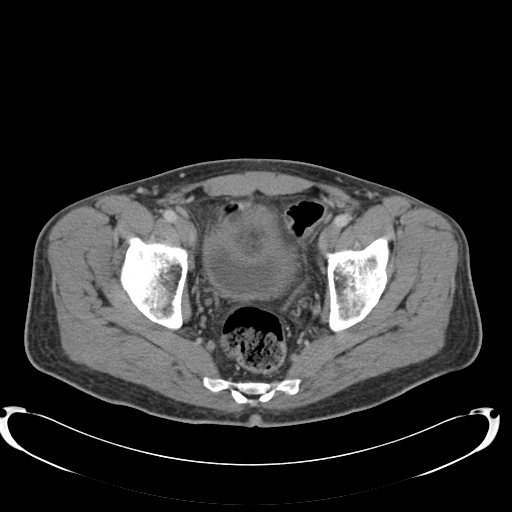
[im 27/101  soft-tissue]
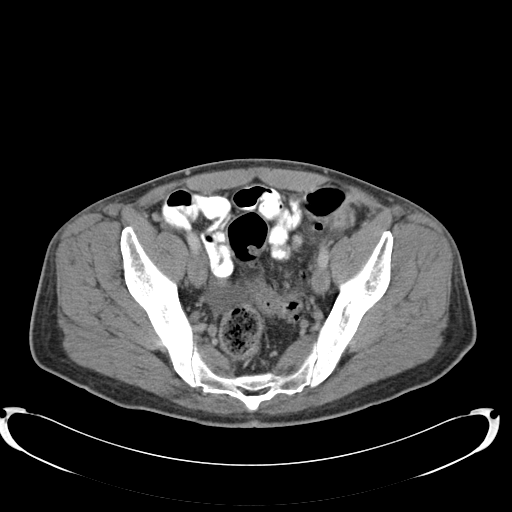
[im 37/101  soft-tissue]
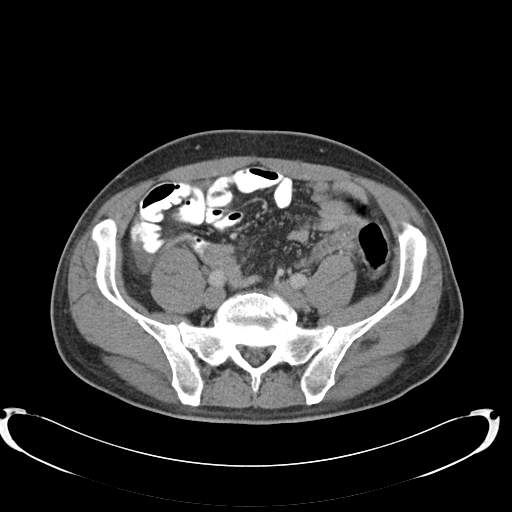
[im 43/101  soft-tissue]
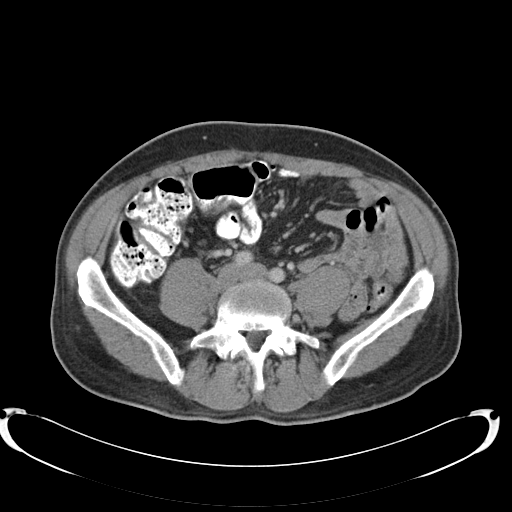
[im 53/101  soft-tissue]
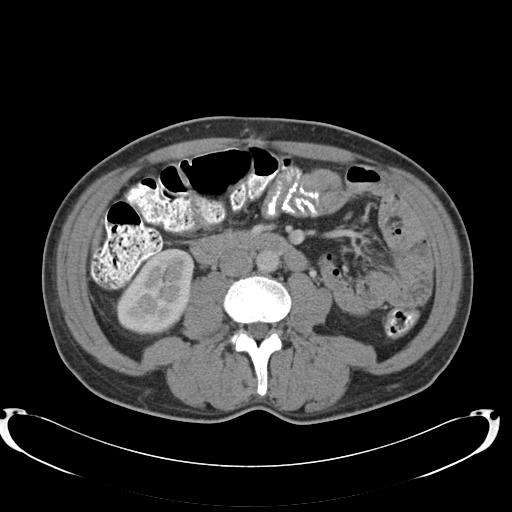
[im 58/101  soft-tissue]
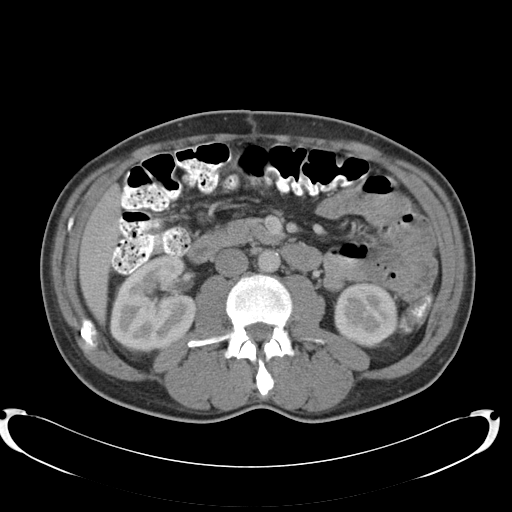
[im 64/101  soft-tissue]
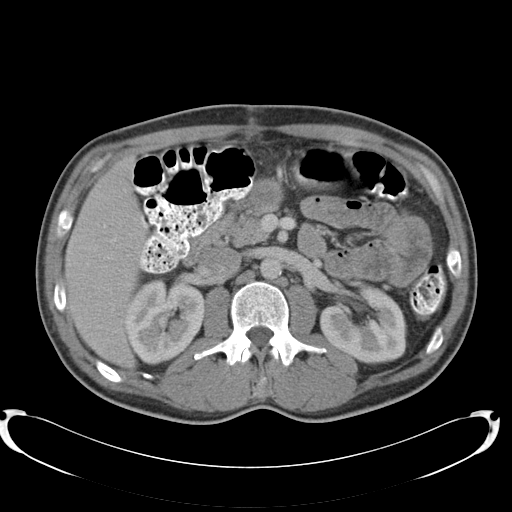
[im 64/101  bone]
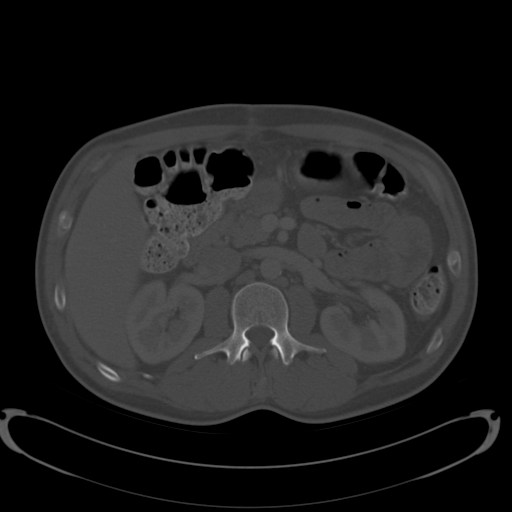
[im 74/101  soft-tissue]
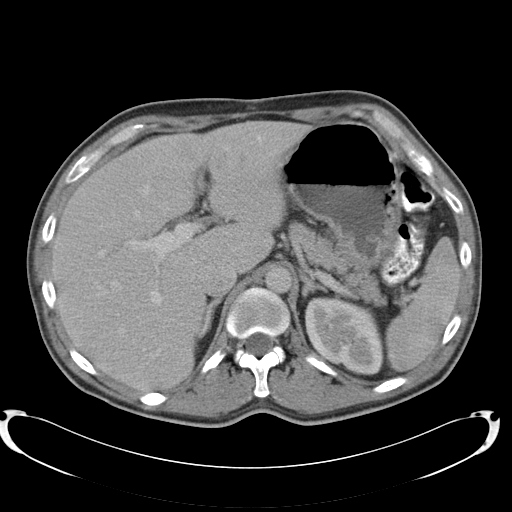
[im 79/101  soft-tissue]
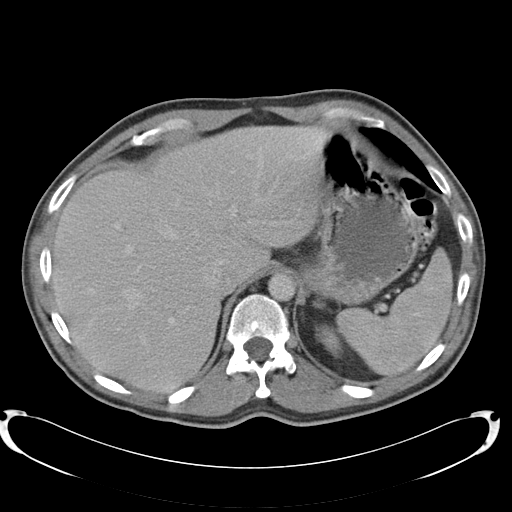
[im 85/101  soft-tissue]
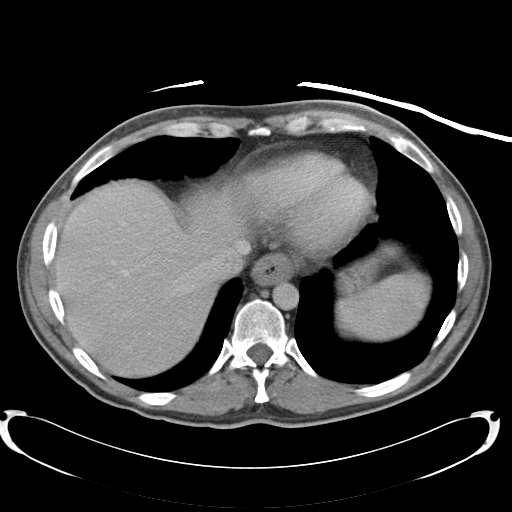
[im 95/101  soft-tissue]
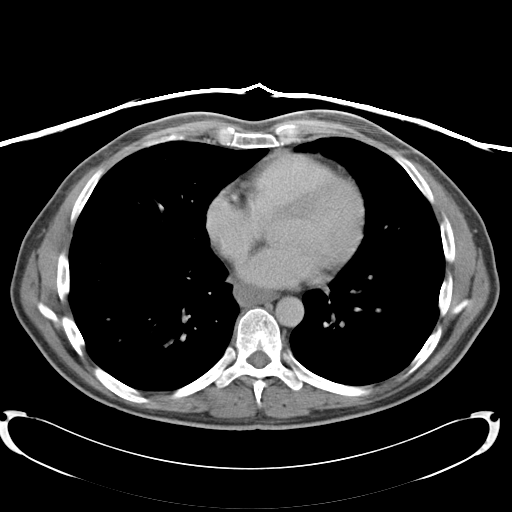

[Series 602: <mpr range> · coronal · 1.01mm/px · 3 of 111 slices shown]
[im 37/111  soft-tissue]
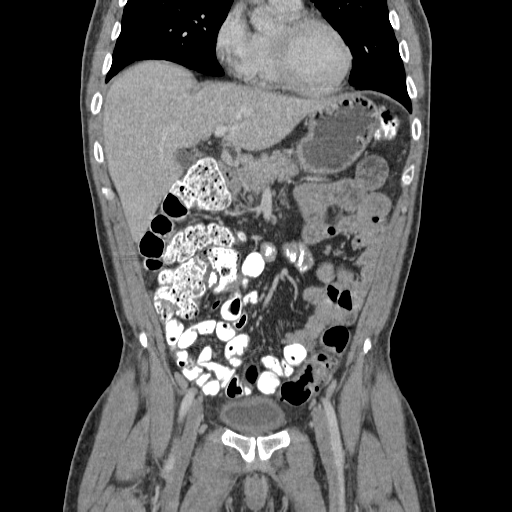
[im 49/111  soft-tissue]
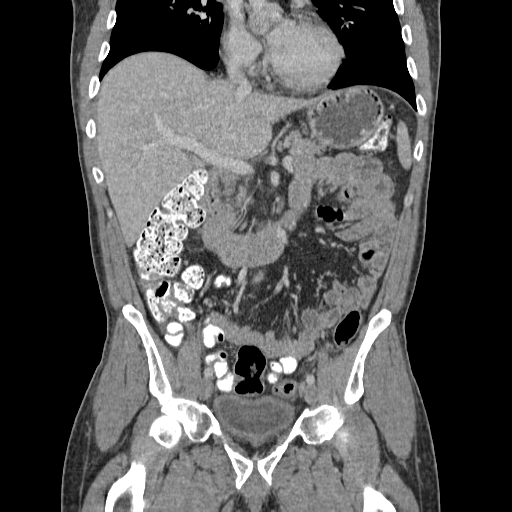
[im 62/111  soft-tissue]
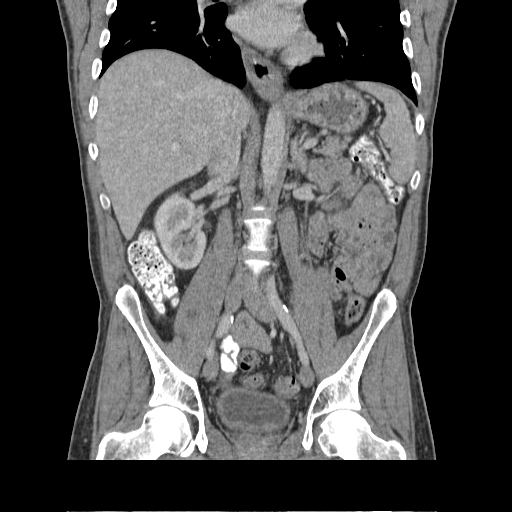

[16 of 46 positions shown; findings below may reference images not displayed]

FINDINGS: There is diffuse circumferential wall thickening in the
distal esophagus, stable since previous study.  5 mm nodule again
noted in the right middle lobe at the right lung base, stable.  No
effusions.  Heart is normal size.

Mild apparent wall thickening in the distal stomach/pyloric region.
Previously seen inflammatory process in the right upper quadrant
has decreased.  There are small locules  of extraluminal gas
anterior to the stomach adjacent to the left lobe of the liver, in
the area of previously seen free air.  This has decreased since
prior study.  Recommend correlation with prior surgical history.
Duodenum grossly unremarkable but nondistended.

No evidence of bowel dilatation.  There is a small amount of free
fluid in the pelvis.  Urinary bladder appears mildly thick-walled
which could be related to cystitis or some degree of bladder outlet
obstruction.

There is a descending colonic and sigmoid diverticulosis.
Stranding noted around several of the diverticula in the proximal
sigmoid colon suggesting active diverticulitis.  This is new since
prior study.

Liver, gallbladder, spleen, pancreas, adrenals and kidneys are
unremarkable.
IMPRESSION: Diffuse wall thickening within the distal esophagus suggesting
esophagitis.  Recommend correlation with any prior endoscopy.
There also is mild apparent wall thickening in the pyloric region
of the stomach which could be related to gastritis or peptic ulcer
disease.  Decreasing but persistent locules of extraluminal gas
anterior to the distal stomach.

Descending colonic and sigmoid diverticulosis.  Stranding around
the proximal sigmoid colon is suspicious for active diverticulitis.

Small amount of free fluid in the pelvis.

Stable right middle lobe 5 mm nodule.

These results were discussed with Dr. Thanh at the time of
interpretation.

## 2012-06-10 ENCOUNTER — Emergency Department (HOSPITAL_COMMUNITY)
Admission: EM | Admit: 2012-06-10 | Discharge: 2012-06-10 | Payer: BC Managed Care – PPO | Attending: Emergency Medicine | Admitting: Emergency Medicine

## 2012-06-10 ENCOUNTER — Encounter (HOSPITAL_COMMUNITY): Payer: Self-pay | Admitting: *Deleted

## 2012-06-10 DIAGNOSIS — Y929 Unspecified place or not applicable: Secondary | ICD-10-CM | POA: Insufficient documentation

## 2012-06-10 DIAGNOSIS — F17200 Nicotine dependence, unspecified, uncomplicated: Secondary | ICD-10-CM | POA: Insufficient documentation

## 2012-06-10 DIAGNOSIS — X58XXXA Exposure to other specified factors, initial encounter: Secondary | ICD-10-CM | POA: Insufficient documentation

## 2012-06-10 DIAGNOSIS — F1010 Alcohol abuse, uncomplicated: Secondary | ICD-10-CM | POA: Insufficient documentation

## 2012-06-10 DIAGNOSIS — F10929 Alcohol use, unspecified with intoxication, unspecified: Secondary | ICD-10-CM

## 2012-06-10 DIAGNOSIS — S0003XA Contusion of scalp, initial encounter: Secondary | ICD-10-CM | POA: Insufficient documentation

## 2012-06-10 DIAGNOSIS — S0083XA Contusion of other part of head, initial encounter: Secondary | ICD-10-CM

## 2012-06-10 DIAGNOSIS — Y939 Activity, unspecified: Secondary | ICD-10-CM | POA: Insufficient documentation

## 2012-06-10 DIAGNOSIS — IMO0002 Reserved for concepts with insufficient information to code with codable children: Secondary | ICD-10-CM | POA: Insufficient documentation
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
## 2013-04-26 ENCOUNTER — Encounter (HOSPITAL_COMMUNITY): Payer: Self-pay | Admitting: *Deleted

## 2013-04-26 ENCOUNTER — Emergency Department (HOSPITAL_COMMUNITY)
Admission: EM | Admit: 2013-04-26 | Discharge: 2013-04-26 | Disposition: A | Discharge status: Home / Self Care | Payer: BC Managed Care – PPO | Attending: Emergency Medicine | Admitting: Emergency Medicine

## 2013-04-26 ENCOUNTER — Encounter (HOSPITAL_COMMUNITY): Payer: Self-pay | Admitting: Emergency Medicine

## 2013-04-26 ENCOUNTER — Inpatient Hospital Stay (HOSPITAL_COMMUNITY)
Admission: AD | Admit: 2013-04-26 | Discharge: 2013-05-03 | DRG: 430 | Disposition: A | Discharge status: Home / Self Care | Payer: BC Managed Care – PPO | Source: Intra-hospital | Attending: Psychiatry | Admitting: Psychiatry

## 2013-04-26 DIAGNOSIS — F1110 Opioid abuse, uncomplicated: Secondary | ICD-10-CM | POA: Diagnosis present

## 2013-04-26 DIAGNOSIS — R000 Tachycardia, unspecified: Secondary | ICD-10-CM | POA: Insufficient documentation

## 2013-04-26 DIAGNOSIS — R630 Anorexia: Secondary | ICD-10-CM | POA: Insufficient documentation

## 2013-04-26 DIAGNOSIS — I10 Essential (primary) hypertension: Secondary | ICD-10-CM | POA: Insufficient documentation

## 2013-04-26 DIAGNOSIS — F339 Major depressive disorder, recurrent, unspecified: Secondary | ICD-10-CM

## 2013-04-26 DIAGNOSIS — F1010 Alcohol abuse, uncomplicated: Secondary | ICD-10-CM | POA: Diagnosis present

## 2013-04-26 DIAGNOSIS — Z8711 Personal history of peptic ulcer disease: Secondary | ICD-10-CM

## 2013-04-26 DIAGNOSIS — F1210 Cannabis abuse, uncomplicated: Secondary | ICD-10-CM | POA: Diagnosis present

## 2013-04-26 DIAGNOSIS — F19939 Other psychoactive substance use, unspecified with withdrawal, unspecified: Secondary | ICD-10-CM

## 2013-04-26 DIAGNOSIS — R259 Unspecified abnormal involuntary movements: Secondary | ICD-10-CM | POA: Insufficient documentation

## 2013-04-26 DIAGNOSIS — Z634 Disappearance and death of family member: Secondary | ICD-10-CM

## 2013-04-26 DIAGNOSIS — F19239 Other psychoactive substance dependence with withdrawal, unspecified: Secondary | ICD-10-CM

## 2013-04-26 DIAGNOSIS — F10239 Alcohol dependence with withdrawal, unspecified: Secondary | ICD-10-CM

## 2013-04-26 DIAGNOSIS — R1013 Epigastric pain: Secondary | ICD-10-CM

## 2013-04-26 DIAGNOSIS — F1020 Alcohol dependence, uncomplicated: Secondary | ICD-10-CM

## 2013-04-26 DIAGNOSIS — Z79899 Other long term (current) drug therapy: Secondary | ICD-10-CM | POA: Insufficient documentation

## 2013-04-26 DIAGNOSIS — F1320 Sedative, hypnotic or anxiolytic dependence, uncomplicated: Secondary | ICD-10-CM

## 2013-04-26 DIAGNOSIS — F411 Generalized anxiety disorder: Secondary | ICD-10-CM | POA: Insufficient documentation

## 2013-04-26 DIAGNOSIS — F10939 Alcohol use, unspecified with withdrawal, unspecified: Secondary | ICD-10-CM

## 2013-04-26 DIAGNOSIS — F17200 Nicotine dependence, unspecified, uncomplicated: Secondary | ICD-10-CM | POA: Insufficient documentation

## 2013-04-26 DIAGNOSIS — F329 Major depressive disorder, single episode, unspecified: Principal | ICD-10-CM | POA: Diagnosis present

## 2013-04-26 DIAGNOSIS — G479 Sleep disorder, unspecified: Secondary | ICD-10-CM | POA: Insufficient documentation

## 2013-04-26 DIAGNOSIS — F3289 Other specified depressive episodes: Secondary | ICD-10-CM | POA: Insufficient documentation

## 2013-04-26 DIAGNOSIS — F1114 Opioid abuse with opioid-induced mood disorder: Secondary | ICD-10-CM

## 2013-04-26 HISTORY — DX: Essential (primary) hypertension: I10

## 2013-04-26 LAB — CBC WITH DIFFERENTIAL/PLATELET
Basophils Absolute: 0 10*3/uL (ref 0.0–0.1)
Basophils Relative: 0 % (ref 0–1)
Eosinophils Absolute: 0.1 10*3/uL (ref 0.0–0.7)
Eosinophils Relative: 1 % (ref 0–5)
HCT: 53.5 % — ABNORMAL HIGH (ref 39.0–52.0)
Hemoglobin: 19.8 g/dL — ABNORMAL HIGH (ref 13.0–17.0)
Lymphocytes Relative: 10 % — ABNORMAL LOW (ref 12–46)
Lymphs Abs: 1.4 K/uL (ref 0.7–4.0)
MCH: 33.7 pg (ref 26.0–34.0)
MCHC: 37 g/dL — ABNORMAL HIGH (ref 30.0–36.0)
MCV: 91.1 fL (ref 78.0–100.0)
Monocytes Absolute: 0.8 10*3/uL (ref 0.1–1.0)
Monocytes Relative: 6 % (ref 3–12)
Neutro Abs: 11.5 10*3/uL — ABNORMAL HIGH (ref 1.7–7.7)
Neutrophils Relative %: 83 % — ABNORMAL HIGH (ref 43–77)
Platelets: 210 10*3/uL (ref 150–400)
RBC: 5.87 MIL/uL — ABNORMAL HIGH (ref 4.22–5.81)
RDW: 14.7 % (ref 11.5–15.5)
WBC: 13.8 K/uL — ABNORMAL HIGH (ref 4.0–10.5)

## 2013-04-26 LAB — COMPREHENSIVE METABOLIC PANEL
AST: 80 U/L — ABNORMAL HIGH (ref 0–37)
Albumin: 3.5 g/dL (ref 3.5–5.2)
Alkaline Phosphatase: 104 U/L (ref 39–117)
BUN: 5 mg/dL — ABNORMAL LOW (ref 6–23)
Calcium: 9.3 mg/dL (ref 8.4–10.5)
GFR calc Af Amer: >90 mL/min (ref >90)
Glucose, Bld: 125 mg/dL — ABNORMAL HIGH (ref 70–99)
Potassium: 4.1 mEq/L (ref 3.5–5.1)
Sodium: 132 mEq/L — ABNORMAL LOW (ref 135–145)
Total Bilirubin: 0.9 mg/dL (ref 0.3–1.2)
Total Protein: 7.4 g/dL (ref 6.0–8.3)

## 2013-04-26 LAB — RAPID URINE DRUG SCREEN, HOSP PERFORMED
Amphetamines: NOT DETECTED
Barbiturates: NOT DETECTED
Benzodiazepines: NOT DETECTED
Cocaine: NOT DETECTED
Opiates: NOT DETECTED
Tetrahydrocannabinol: POSITIVE — AB

## 2013-04-26 LAB — COMPREHENSIVE METABOLIC PANEL WITH GFR
ALT: 87 U/L — ABNORMAL HIGH (ref 0–53)
CO2: 22 meq/L (ref 19–32)
Chloride: 96 meq/L (ref 96–112)
Creatinine, Ser: 0.73 mg/dL (ref 0.50–1.35)
GFR calc non Af Amer: >90 mL/min (ref >90)

## 2013-04-26 LAB — ETHANOL: Alcohol, Ethyl (B): <11 mg/dL (ref 0–11)

## 2013-04-26 MED ORDER — TRAZODONE HCL 100 MG PO TABS
100.0000 mg | ORAL_TABLET | Freq: Every evening | ORAL | Status: DC | PRN
  Administered 2013-04-26 – 2013-04-29 (×5): 100 mg via ORAL
  Filled 2013-04-26 (×5): qty 1

## 2013-04-26 MED ORDER — LORAZEPAM 2 MG/ML IJ SOLN
2.0000 mg | Freq: Once | INTRAMUSCULAR | Status: AC
Start: 2013-04-26 — End: 2013-04-26
  Administered 2013-04-26: 2 mg via INTRAVENOUS

## 2013-04-26 MED ORDER — SERTRALINE HCL 50 MG PO TABS: 25.0000 mg | ORAL_TABLET | Freq: Every day | ORAL | Status: DC

## 2013-04-26 MED ORDER — CHLORDIAZEPOXIDE HCL 25 MG PO CAPS
25.0000 mg | ORAL_CAPSULE | Freq: Every day | ORAL | Status: AC
  Administered 2013-04-30: 25 mg via ORAL
  Filled 2013-04-26: qty 1

## 2013-04-26 MED ORDER — CHLORDIAZEPOXIDE HCL 25 MG PO CAPS
25.0000 mg | ORAL_CAPSULE | Freq: Four times a day (QID) | ORAL | Status: AC | PRN
  Administered 2013-04-26 – 2013-04-29 (×3): 25 mg via ORAL
  Filled 2013-04-26 (×4): qty 1

## 2013-04-26 MED ORDER — CLONIDINE HCL 0.1 MG PO TABS
0.2000 mg | ORAL_TABLET | Freq: Two times a day (BID) | ORAL | Status: DC
  Administered 2013-04-26: 0.2 mg via ORAL
  Filled 2013-04-26: qty 2

## 2013-04-26 MED ORDER — LORAZEPAM 1 MG PO TABS: 0.0000 mg | ORAL_TABLET | Freq: Four times a day (QID) | ORAL | Status: DC

## 2013-04-26 MED ORDER — METHOCARBAMOL 500 MG PO TABS
500.0000 mg | ORAL_TABLET | Freq: Three times a day (TID) | ORAL | Status: AC | PRN
  Administered 2013-04-26 – 2013-05-01 (×5): 500 mg via ORAL
  Filled 2013-04-26 (×5): qty 1

## 2013-04-26 MED ORDER — MAGNESIUM HYDROXIDE 400 MG/5ML PO SUSP: 30.0000 mL | Freq: Every day | ORAL | Status: DC | PRN

## 2013-04-26 MED ORDER — LORAZEPAM 1 MG PO TABS: 0.0000 mg | ORAL_TABLET | Freq: Two times a day (BID) | ORAL | Status: DC

## 2013-04-26 MED ORDER — DICYCLOMINE HCL 20 MG PO TABS
20.0000 mg | ORAL_TABLET | Freq: Four times a day (QID) | ORAL | Status: AC | PRN
  Administered 2013-04-26 – 2013-04-27 (×3): 20 mg via ORAL
  Filled 2013-04-26 (×3): qty 1

## 2013-04-26 MED ORDER — CLONIDINE HCL 0.1 MG PO TABS
0.1000 mg | ORAL_TABLET | Freq: Every day | ORAL | Status: AC
  Administered 2013-05-01 – 2013-05-02 (×2): 0.1 mg via ORAL
  Filled 2013-04-26 (×3): qty 1

## 2013-04-26 MED ORDER — CHLORDIAZEPOXIDE HCL 25 MG PO CAPS
25.0000 mg | ORAL_CAPSULE | Freq: Four times a day (QID) | ORAL | Status: AC
  Administered 2013-04-26 – 2013-04-27 (×6): 25 mg via ORAL
  Filled 2013-04-26 (×6): qty 1

## 2013-04-26 MED ORDER — LORAZEPAM 2 MG/ML IJ SOLN: 0.0000 mg | Freq: Four times a day (QID) | INTRAMUSCULAR | Status: DC

## 2013-04-26 MED ORDER — VITAMIN B-1 100 MG PO TABS
100.0000 mg | ORAL_TABLET | Freq: Every day | ORAL | Status: DC
  Administered 2013-04-26 – 2013-05-03 (×8): 100 mg via ORAL
  Filled 2013-04-26 (×9): qty 1

## 2013-04-26 MED ORDER — THIAMINE HCL 100 MG/ML IJ SOLN: 100.0000 mg | Freq: Every day | INTRAMUSCULAR | Status: DC

## 2013-04-26 MED ORDER — LORAZEPAM 2 MG/ML IJ SOLN
2.0000 mg | Freq: Once | INTRAMUSCULAR | Status: DC
  Filled 2013-04-26: qty 1

## 2013-04-26 MED ORDER — FOLIC ACID 1 MG PO TABS: 1.0000 mg | ORAL_TABLET | Freq: Every day | ORAL | Status: DC

## 2013-04-26 MED ORDER — THIAMINE HCL 100 MG/ML IJ SOLN: 100.0000 mg | Freq: Once | INTRAMUSCULAR | Status: DC

## 2013-04-26 MED ORDER — CHLORDIAZEPOXIDE HCL 25 MG PO CAPS
25.0000 mg | ORAL_CAPSULE | ORAL | Status: AC
  Administered 2013-04-29 (×2): 25 mg via ORAL
  Filled 2013-04-26 (×2): qty 1

## 2013-04-26 MED ORDER — LORAZEPAM 2 MG/ML IJ SOLN
2.0000 mg | Freq: Once | INTRAMUSCULAR | Status: AC
  Administered 2013-04-26: 2 mg via INTRAVENOUS
  Filled 2013-04-26: qty 1

## 2013-04-26 MED ORDER — CLONIDINE HCL 0.1 MG PO TABS
0.1000 mg | ORAL_TABLET | Freq: Four times a day (QID) | ORAL | Status: AC
  Administered 2013-04-26 – 2013-04-28 (×10): 0.1 mg via ORAL
  Filled 2013-04-26 (×12): qty 1

## 2013-04-26 MED ORDER — CLONIDINE HCL 0.1 MG PO TABS
0.1000 mg | ORAL_TABLET | ORAL | Status: AC
  Administered 2013-04-29 – 2013-04-30 (×4): 0.1 mg via ORAL
  Filled 2013-04-26 (×4): qty 1

## 2013-04-26 MED ORDER — VITAMIN B-1 100 MG PO TABS: 100.0000 mg | ORAL_TABLET | Freq: Every day | ORAL | Status: DC

## 2013-04-26 MED ORDER — LOPERAMIDE HCL 2 MG PO CAPS: 2.0000 mg | ORAL_CAPSULE | ORAL | Status: AC | PRN

## 2013-04-26 MED ORDER — FAMOTIDINE 20 MG PO TABS
20.0000 mg | ORAL_TABLET | Freq: Every day | ORAL | Status: DC
  Administered 2013-04-26: 20 mg via ORAL
  Filled 2013-04-26: qty 1

## 2013-04-26 MED ORDER — ONDANSETRON 4 MG PO TBDP
4.0000 mg | ORAL_TABLET | Freq: Four times a day (QID) | ORAL | Status: AC | PRN
  Administered 2013-04-26 – 2013-04-28 (×2): 4 mg via ORAL
  Filled 2013-04-26 (×2): qty 1

## 2013-04-26 MED ORDER — ADULT MULTIVITAMIN W/MINERALS CH: 1.0000 | ORAL_TABLET | Freq: Every day | ORAL | Status: DC

## 2013-04-26 MED ORDER — SERTRALINE HCL 50 MG PO TABS
25.0000 mg | ORAL_TABLET | Freq: Every day | ORAL | Status: DC
  Administered 2013-04-26: 25 mg via ORAL
  Filled 2013-04-26: qty 0.5
  Filled 2013-04-26: qty 1

## 2013-04-26 MED ORDER — SERTRALINE HCL 50 MG PO TABS
50.0000 mg | ORAL_TABLET | Freq: Every day | ORAL | Status: DC
  Administered 2013-04-27 – 2013-04-30 (×4): 50 mg via ORAL
  Filled 2013-04-26 (×6): qty 1

## 2013-04-26 MED ORDER — ACETAMINOPHEN 325 MG PO TABS: 650.0000 mg | ORAL_TABLET | Freq: Four times a day (QID) | ORAL | Status: DC | PRN

## 2013-04-26 MED ORDER — NICOTINE 21 MG/24HR TD PT24
21.0000 mg | MEDICATED_PATCH | Freq: Every day | TRANSDERMAL | Status: DC
  Administered 2013-04-26: 21 mg via TRANSDERMAL
  Filled 2013-04-26: qty 1

## 2013-04-26 MED ORDER — LORAZEPAM 1 MG PO TABS: 1.0000 mg | ORAL_TABLET | Freq: Four times a day (QID) | ORAL | Status: DC | PRN

## 2013-04-26 MED ORDER — HYDROXYZINE HCL 25 MG PO TABS
25.0000 mg | ORAL_TABLET | Freq: Four times a day (QID) | ORAL | Status: AC | PRN
  Administered 2013-04-26 – 2013-05-02 (×16): 25 mg via ORAL
  Filled 2013-04-26 (×17): qty 1

## 2013-04-26 MED ORDER — LORAZEPAM 2 MG/ML IJ SOLN: 2.0000 mg | Freq: Once | INTRAMUSCULAR | Status: DC

## 2013-04-26 MED ORDER — LORAZEPAM 2 MG/ML IJ SOLN: 0.0000 mg | Freq: Two times a day (BID) | INTRAMUSCULAR | Status: DC

## 2013-04-26 MED ORDER — BUSPIRONE HCL 10 MG PO TABS
10.0000 mg | ORAL_TABLET | Freq: Three times a day (TID) | ORAL | Status: DC
  Administered 2013-04-27 – 2013-05-02 (×17): 10 mg via ORAL
  Filled 2013-04-26 (×18): qty 1
  Filled 2013-04-26: qty 2
  Filled 2013-04-26: qty 1

## 2013-04-26 MED ORDER — LORAZEPAM 2 MG/ML IJ SOLN: 1.0000 mg | Freq: Four times a day (QID) | INTRAMUSCULAR | Status: DC | PRN

## 2013-04-26 MED ORDER — ADULT MULTIVITAMIN W/MINERALS CH
1.0000 | ORAL_TABLET | Freq: Every day | ORAL | Status: DC
  Administered 2013-04-26 – 2013-05-03 (×8): 1 via ORAL
  Filled 2013-04-26 (×9): qty 1

## 2013-04-26 MED ORDER — CHLORDIAZEPOXIDE HCL 25 MG PO CAPS
25.0000 mg | ORAL_CAPSULE | Freq: Three times a day (TID) | ORAL | Status: AC
  Administered 2013-04-28 (×3): 25 mg via ORAL
  Filled 2013-04-26 (×3): qty 1

## 2013-04-26 MED ORDER — ALUM & MAG HYDROXIDE-SIMETH 200-200-20 MG/5ML PO SUSP: 30.0000 mL | ORAL | Status: DC | PRN

## 2013-04-26 MED ORDER — ONDANSETRON HCL 4 MG PO TABS
8.0000 mg | ORAL_TABLET | Freq: Two times a day (BID) | ORAL | Status: DC
  Administered 2013-04-26: 8 mg via ORAL
  Filled 2013-04-26: qty 2

## 2013-04-26 MED ORDER — NAPROXEN 500 MG PO TABS
500.0000 mg | ORAL_TABLET | Freq: Two times a day (BID) | ORAL | Status: AC | PRN
  Administered 2013-04-28: 500 mg via ORAL
  Filled 2013-04-26: qty 1

## 2013-04-26 MED ORDER — THIAMINE HCL 100 MG/ML IJ SOLN
Freq: Once | INTRAVENOUS | Status: AC
  Administered 2013-04-26: 12:00:00 via INTRAVENOUS
  Filled 2013-04-26: qty 1000

## 2013-04-26 NOTE — ED Notes (Signed)
Pt pulled out IV prior to transfer.  IV site cleaned and dressed.  Pt given new scrubs.

## 2013-04-26 NOTE — Progress Notes (Signed)
D.  Pt pleasant on approach, new admission, did not feel well enough to attend evening wrap up group.  Experiencing withdrawal symptoms, see CIWA/COW flowsheets.  Requested and received pitcher of Gatorade.  Denies SI/HI/hallucinations at this time, does experience passive SI at times though.  Contracts for safety on unit.  See admission note.  A.  Support and encouragement offered, medications given as ordered for withdrawal symptoms.  R.  Pt remains safe on unit, no acute distress noted, will continue to monitor.

## 2013-04-26 NOTE — BHH Counselor (Signed)
Pt has been accepted by Nanine Means to Dr. Dub Mikes, [phone], documents have been signed and faxed to [facility]. Denice Bors, [facility] 04/26/2013 2:41 PM
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
## 2013-04-26 NOTE — ED Notes (Addendum)
Pt from home requesting detox. Pt reports that he drinks between 24-30 12 oz beers/day and 15-20 oxycodone/day. Last drink 10pm last pm and hs not taken oxycodone in past 3-4 days. Pt A&O and in NAD. Pt has placement at [facility] in [place] after he is successfully detoxed.

## 2013-04-26 NOTE — Tx Team (Signed)
Initial Interdisciplinary Treatment Plan  PATIENT STRENGTHS: (choose at least two) Ability for insight Average or above average intelligence Capable of independent living Communication skills Financial means General fund of knowledge Motivation for treatment/growth Physical Health Supportive family/friends  PATIENT STRESSORS: Legal issue Occupational concerns Substance abuse   PROBLEM LIST: Problem List/Patient Goals Date to be addressed Date deferred Reason deferred Estimated date of resolution  Substance abuse  Etoh/opiates/THC 10/11   D/C        Unresolved grief 10/11   D/c                                       DISCHARGE CRITERIA:  Ability to meet basic life and health needs Improved stabilization in mood, thinking, and/or behavior Motivation to continue treatment in a less acute level of care Verbal commitment to aftercare and medication compliance  PRELIMINARY DISCHARGE PLAN: Attend 12-step recovery group Outpatient therapy Return to previous living arrangement  PATIENT/FAMIILY INVOLVEMENT: This treatment plan has been presented to and reviewed with the patient, Ronald Robertson, and/or family member  The patient and family have been given the opportunity to ask questions and make suggestions.  Cresenciano Lick 04/26/2013, 6:11 PM

## 2013-04-26 NOTE — ED Notes (Signed)
Pt contact: Ronald Robertson [phone] Octavio Manns [phone]

## 2013-04-26 NOTE — BH Assessment (Signed)
Assessment Note  Ronald Robertson is a 52 y.o. male who presents to emerg dept for detox from alcohol and opiates(oxycodone).  Pt denies SI/HI/Psych.  Pt says he drinks 24-30 beers daily, last drink was 04/25/13, pt consumed 26-27 beers. Pt is prescribed oxycodone due to surgery(ulcer). Pt admits to abusing his medications, taking 15-20 5mg  pills, daily; last use was 2 days ago.  Pt used 15-5mg  pills. Pt denies any seizure activity, does experience blackouts.  Pt says he's not been able to eat in 5 days and c/o w/d sxs: shakes, anxiety, stomach pain, and nausea/vomitting.  Pt has previous detox admissions with [facility] [phone]) and [facility](2003).  Pt has placement with [facility]([place]) once detox is complete  Axis I: Alcohol Dependence; Opiate Dependence  Axis II: Deferred Axis III:  Past Medical History  Diagnosis Date  . Hypertension    Axis IV: other psychosocial or environmental problems, problems related to social environment and problems with primary support group Axis V: 51-60 moderate symptoms  Past Medical History:  Past Medical History  Diagnosis Date  . Hypertension     Past Surgical History  Procedure Laterality Date  . Abdominal surgery      ulcer repair  . Gi ulcer rupture    . Shoulder fusion surgery Left   . Tonsillectomy      Family History:  Family History  Problem Relation Age of Onset  . Adopted: Yes    Social History:  reports that he has been smoking.  He does not have any smokeless tobacco history on file. He reports that he drinks about 126.0 ounces of alcohol per week. He reports that he uses illicit drugs (Marijuana).  Additional Social History:  Alcohol / Drug Use Pain Medications: Oxycodone  Prescriptions: Zoloft  Over the Counter: None  History of alcohol / drug use?: Yes Longest period of sobriety (when/how long): When in detox  Negative Consequences of Use: Work / School;Personal relationships;Financial Withdrawal Symptoms: Nausea  / Vomiting;Tremors;Other (Comment) (stomach pain; dry heaves ) Substance #1 Name of Substance 1: Alcohol  1 - Age of First Use: Teens  1 - Amount (size/oz): 24-30 Beers 1 - Frequency: Daily  1 - Duration: On-going  1 - Last Use / Amount: 04/25/13 Substance #2 Name of Substance 2: Oxycodone  2 - Age of First Use: 40's  2 - Amount (size/oz): 15-20 5mg   2 - Frequency: Daily  2 - Duration: On-going  2 - Last Use / Amount: 2 Days Ago   CIWA: CIWA-Ar BP: 123/85 mmHg Pulse Rate: 107 Nausea and Vomiting: 2 Tactile Disturbances: none Tremor: three Auditory Disturbances: not present Paroxysmal Sweats: two Visual Disturbances: not present Anxiety: two Headache, Fullness in Head: none present Agitation: somewhat more than normal activity Orientation and Clouding of Sensorium: oriented and can do serial additions CIWA-Ar Total: 10 COWS:    Allergies: No Known Allergies  Home Medications:  (Not in a hospital admission)  OB/GYN Status:  No LMP for male patient.  General Assessment Data Location of Assessment: [facility] Is this a Tele or Face-to-Face Assessment?: Tele Assessment Is this an Initial Assessment or a Re-assessment for this encounter?: Initial Assessment Living Arrangements: Spouse/significant other Can pt return to current living arrangement?: Yes Admission Status: Voluntary Is patient capable of signing voluntary admission?: Yes Transfer from: Acute Hospital Referral Source: MD  Medical Screening Exam [facility] Walk-in ONLY) Medical Exam completed: No Reason for MSE not completed: Other: (None )  [facility] Crisis Care Plan Living Arrangements: Spouse/significant  other Name of Psychiatrist: None  Name of Therapist: None   Education Status Is patient currently in school?: No Current Grade: None  Highest grade of school patient has completed: None  Name of school: None  Contact person: None   Risk to self Suicidal Ideation: No Suicidal Intent: No Is patient at risk  for suicide?: No Suicidal Plan?: No Access to Means: No What has been your use of drugs/alcohol within the last 12 months?: Abusing: alcohol; oxycodone Previous Attempts/Gestures: No How many times?: 0 Other Self Harm Risks: None  Triggers for Past Attempts: None known Intentional Self Injurious Behavior: None Family Suicide History: No Recent stressful life event(s): Other (Comment) (SA ) Persecutory voices/beliefs?: No Depression: Yes Depression Symptoms: Loss of interest in usual pleasures Substance abuse history and/or treatment for substance abuse?: Yes Suicide prevention information given to non-admitted patients: Not applicable  Risk to Others Homicidal Ideation: No Thoughts of Harm to Others: No Current Homicidal Intent: No Current Homicidal Plan: No Access to Homicidal Means: No Identified Victim: None  History of harm to others?: No Assessment of Violence: None Noted Violent Behavior Description: None  Does patient have access to weapons?: No Criminal Charges Pending?: No Does patient have a court date: No  Psychosis Hallucinations: None noted Delusions: None noted  Mental Status Report Appear/Hygiene: Other (Comment) (Appropriate ) Eye Contact: Fair Motor Activity: Unremarkable Speech: Logical/coherent Level of Consciousness: Alert Mood: Sad Affect: Sad Anxiety Level: Minimal Thought Processes: Coherent;Relevant Judgement: Unimpaired Orientation: Person;Place;Time;Situation Obsessive Compulsive Thoughts/Behaviors: None  Cognitive Functioning Concentration: Normal Memory: Recent Intact;Remote Intact IQ: Average Insight: Fair Impulse Control: Fair Appetite: Poor Weight Loss:  (No food intake x5 days ) Weight Gain: 0 Sleep: No Change Total Hours of Sleep: 6 Vegetative Symptoms: None  ADLScreening [facility] Assessment Services) Patient's cognitive ability adequate to safely complete daily activities?: Yes Patient able to express need for assistance  with ADLs?: Yes Independently performs ADLs?: Yes (appropriate for developmental age)  Prior Inpatient Therapy Prior Inpatient Therapy: Yes Prior Therapy Dates: 2003, 1986 Prior Therapy Facilty/Provider(s): [facility]; [facility]  Reason for Treatment: Detox?rehab   Prior Outpatient Therapy Prior Outpatient Therapy: No Prior Therapy Dates: None  Prior Therapy Facilty/Provider(s): None  Reason for Treatment: None   ADL Screening (condition at time of admission) Patient's cognitive ability adequate to safely complete daily activities?: Yes Is the patient deaf or have difficulty hearing?: No Does the patient have difficulty seeing, even when wearing glasses/contacts?: No Does the patient have difficulty concentrating, remembering, or making decisions?: No Patient able to express need for assistance with ADLs?: Yes Does the patient have difficulty dressing or bathing?: No Independently performs ADLs?: Yes (appropriate for developmental age) Does the patient have difficulty walking or climbing stairs?: No Weakness of Legs: None Weakness of Arms/Hands: None  Home Assistive Devices/Equipment Home Assistive Devices/Equipment: None  Therapy Consults (therapy consults require a physician order) PT Evaluation Needed: No OT Evalulation Needed: No SLP Evaluation Needed: No Abuse/Neglect Assessment (Assessment to be complete while patient is alone) Physical Abuse: Denies Verbal Abuse: Denies Sexual Abuse: Denies Exploitation of patient/patient's resources: Denies Self-Neglect: Denies Values / Beliefs Cultural Requests During Hospitalization: None Spiritual Requests During Hospitalization: None Consults Spiritual Care Consult Needed: No Social Work Consult Needed: No Merchant navy officer (For Healthcare) Advance Directive: Patient does not have advance directive;Patient would not like information Pre-existing out of facility DNR order (yellow form or pink MOST form):  No Nutrition Screen- [facility] Adult/[facility] Patient's home diet: Regular  Additional Information 1:1  In Past 12 Months?: No CIRT Risk: No Elopement Risk: No Does patient have medical clearance?: Yes     Disposition:  Disposition Initial Assessment Completed for this Encounter: Yes Disposition of Patient: Inpatient treatment program;Referred to [facility] ) Type of inpatient treatment program: Adult Patient referred to: Other (Comment) [facility] )  On Site Evaluation by:   Reviewed with Physician:    Murrell Redden 04/26/2013 12:51 PM

## 2013-04-26 NOTE — Progress Notes (Signed)
Nursing admit note- Ronald Robertson is a 52 y/o w/w/m admitted voluntarily following medical clearance requesting etoh/opiate detox.  Reports etoh use of 20+ beers per day and vicodin 15- 20 tabs per day along with daily thc use.  Patient reports wife's death 1 month ago was trigger for drinking and opiate use began in 2011 with ulcer surgery.  He has placement at [facility] following detox.  He presents cooperative and tearful with c/o stomach cramping, loose stools and anxiety.  Denies SI and contracts for safety.  Fluids encouraged.  POC and 15' checks initiated.  Oriented to unit.  Safety maintained.

## 2013-04-26 NOTE — ED Provider Notes (Addendum)
CSN: [id number]     Arrival date & time 04/26/13  1000 History   First MD Initiated Contact with Patient 04/26/13 1034     Chief Complaint  Patient presents with  . Withdrawal   (Consider location/radiation/quality/duration/timing/severity/associated sxs/prior Treatment) HPI Comments: Pt has had addictions to narcotics in the past, has been taking 15-20 tablets of 5 mg oxycodone daily, last use 3 days ago, also drinks about 30 beers daily for pst month since spouse passed away from liver failure.  Pt has a place in a [facility] treatment facility, but was told that he needed detox first.  No SI, plan, no hallucinations, has never gone through severe withdrawals in the past so unknown if he is at risk for DT's or seizures.  Was clean from alcohol for some time until he relapsed last month.  No h/o IV drugs  Patient is a 52 y.o. male presenting with mental health disorder. The history is provided by the patient.  Mental Health Problem Presenting symptoms: depression   Presenting symptoms: no suicidal thoughts   Patient accompanied by:  Friend Onset quality:  Gradual Duration:  1 month Timing:  Constant Progression:  Worsening Chronicity:  Recurrent Context: alcohol use, drug abuse and stressful life event   Treatment compliance:  Most of the time Relieved by:  Nothing Worsened by:  Nothing tried Associated symptoms: anhedonia, anxiety, appetite change, decreased need for sleep and feelings of worthlessness   Associated symptoms: no abdominal pain and no chest pain     Past Medical History  Diagnosis Date  . Hypertension    Past Surgical History  Procedure Laterality Date  . Abdominal surgery      ulcer repair  . Gi ulcer rupture    . Shoulder fusion surgery Left   . Tonsillectomy     Family History  Problem Relation Age of Onset  . Adopted: Yes   History  Substance Use Topics  . Smoking status: Current Every Day Smoker -- 1.00 packs/day for 35 years  . Smokeless tobacco: Not  on file  . Alcohol Use: 126.0 oz/week    210 Cans of beer per week    Review of Systems  Constitutional: Positive for appetite change.  Respiratory: Negative for cough and shortness of breath.   Cardiovascular: Negative for chest pain.  Gastrointestinal: Negative for nausea, vomiting and abdominal pain.  Psychiatric/Behavioral: Positive for sleep disturbance and dysphoric mood. Negative for suicidal ideas. The patient is nervous/anxious.   All other systems reviewed and are negative.    Allergies  Review of patient's allergies indicates no known allergies.  Home Medications   Current Outpatient Rx  Name  Route  Sig  Dispense  Refill  . oxyCODONE (OXY IR/ROXICODONE) 5 MG immediate release tablet   Oral   Take 5 mg by mouth every 4 (four) hours as needed for pain.          Marland Kitchen sertraline (ZOLOFT) 25 MG tablet   Oral   Take 25 mg by mouth daily.          BP 123/85  Pulse 107  Temp(Src) 97.5 F (36.4 C) (Oral)  Resp 20  SpO2 95% Physical Exam  Nursing note and vitals reviewed. Constitutional: He is oriented to person, place, and time. He appears well-developed and well-nourished. He is cooperative.  Non-toxic appearance. He appears ill. No distress.  HENT:  Head: Normocephalic and atraumatic.  Mouth/Throat: Uvula is midline. Mucous membranes are dry.  Eyes: Conjunctivae and EOM are normal. No scleral  icterus.  Neck: Normal range of motion. Neck supple.  Cardiovascular: Intact distal pulses.  Tachycardia present.   No murmur heard. Pulmonary/Chest: Effort normal. No respiratory distress. He has no wheezes.  Abdominal: Soft. He exhibits no distension. There is no tenderness. There is no rebound and no guarding.  Neurological: He is alert and oriented to person, place, and time. He displays tremor. No cranial nerve deficit or sensory deficit. He exhibits normal muscle tone. Coordination normal. GCS eye subscore is 4. GCS verbal subscore is 5. GCS motor subscore is 6.   Skin: Skin is warm. He is not diaphoretic.  Psychiatric: Judgment normal.    ED Course  ED EKG  Date/Time: 04/26/2013 11:13 AM Performed by: Lear Ng. Authorized by: Lear Ng Interpreted by ED physician Comparison: compared with previous ECG  Similar to previous ECG Comparison to previous ECG: Except rate is faster, artifact precludes serial comparison that is easily done Rhythm: sinus tachycardia Rate: tachycardic BPM: 114 QRS axis: right ST Segments: ST segments normal Other findings: PRWP Other findings comments: low voltages Clinical impression: abnormal ECG   (including critical care time)   CRITICAL CARE Performed by: Lear Ng Total critical care time: 30 min Critical care time was exclusive of separately billable procedures and treating other patients. Critical care was necessary to treat or prevent imminent or life-threatening deterioration. Critical care was time spent personally by me on the following activities: development of treatment plan with patient and/or surrogate as well as nursing, discussions with consultants, evaluation of patient's response to treatment, examination of patient, obtaining history from patient or surrogate, ordering and performing treatments and interventions, ordering and review of laboratory studies, ordering and review of radiographic studies, pulse oximetry and re-evaluation of patient's condition.   Labs Review Labs Reviewed  CBC WITH DIFFERENTIAL - Abnormal; Notable for the following:    WBC 13.8 (*)    RBC 5.87 (*)    Hemoglobin 19.8 (*)    HCT 53.5 (*)    MCHC 37.0 (*)    Neutrophils Relative % 83 (*)    Lymphocytes Relative 10 (*)    Neutro Abs 11.5 (*)    All other components within normal limits  COMPREHENSIVE METABOLIC PANEL - Abnormal; Notable for the following:    Sodium 132 (*)    Glucose, Bld 125 (*)    BUN 5 (*)    AST 80 (*)    ALT 87 (*)    All other components within normal limits   URINE RAPID DRUG SCREEN (HOSP PERFORMED) - Abnormal; Notable for the following:    Tetrahydrocannabinol POSITIVE (*)    All other components within normal limits  ETHANOL   Imaging Review No results found.  EKG Interpretation     Ventricular Rate:  114 PR Interval:  162 QRS Duration: 81 QT Interval:  316 QTC Calculation: 436 R Axis:   94 Text Interpretation:  Sinus tachycardia Borderline right axis deviation Low voltage, precordial leads Poor R wave progression V2-V4, seen previously artifact and wandering baseline III, aVF, V3, V6           ra sat is 98% and I interpret to be adequate   1:04 PM WBC is up, but non specific.  Hgb is up, likely related to being dehydrated and the fact he smokes, he is concentrated.  LFT's are minimally elevated, chronic alcohol use is likely explanation and can be followed as outpt.  Na being minimally low is non contributory.  Drug screen is also +  for marijuana, incidental.  Awaiting TTS eval and recommendations.  Pt would benefit from inpt detox in my opinion.  Pt is medically cleared from acute emergent condition for now.  2:26 PM Pt is accepted to [facility] by Dr. Dub Mikes  MDM   1. Acute drug withdrawal syndrome      Pt is tremulous, anxious, tachycardic, likely in sig withdrawal.  Issues are risk of going into DT's, likely is dehydrated and may have electrolyte and vitamin/electrolyte deficiencies.  Will give IV "banana bag" and start on IV CIWA protocol.  Will discuss with TTS.  Pt has no SI, HI, is depressed due to loss of spouse.  Pt will need monitoring, treatment for nausea, tremors, withdrawal symptoms.  Will start clonidine BID as well as home meds.  Will check electrolytes and LFT's, consider inpt admission.     Gavin Pound. Oletta Lamas, MD 04/26/13 1306  Gavin Pound. Oletta Lamas, MD 04/26/13 1350  Gavin Pound. Anni Hocevar, MD 04/26/13 1426

## 2013-04-26 NOTE — BHH Counselor (Signed)
Pt accepted by Nanine Means, NP, [phone].

## 2013-04-26 NOTE — ED Notes (Signed)
Pt is aware that all ordered medication have been given.  Pt is concerned that Ghim MD mentioned giving him a medication that starts with a "d."  This RN followed up with MD and MD thinks the Pt is referring to clonidine.  Pt made aware that he has been given the medication.

## 2013-04-27 DIAGNOSIS — F10239 Alcohol dependence with withdrawal, unspecified: Secondary | ICD-10-CM

## 2013-04-27 DIAGNOSIS — F1020 Alcohol dependence, uncomplicated: Secondary | ICD-10-CM

## 2013-04-27 DIAGNOSIS — F339 Major depressive disorder, recurrent, unspecified: Secondary | ICD-10-CM

## 2013-04-27 LAB — COMPREHENSIVE METABOLIC PANEL
AST: 70 U/L — ABNORMAL HIGH (ref 0–37)
Albumin: 2.9 g/dL — ABNORMAL LOW (ref 3.5–5.2)
Alkaline Phosphatase: 72 U/L (ref 39–117)
Chloride: 103 mEq/L (ref 96–112)
Potassium: 4 mEq/L (ref 3.5–5.1)
Sodium: 138 mEq/L (ref 135–145)
Total Bilirubin: 0.7 mg/dL (ref 0.3–1.2)

## 2013-04-27 MED ORDER — GATORADE (BH)
240.0000 mL | Freq: Four times a day (QID) | ORAL | Status: AC
  Administered 2013-04-27 – 2013-04-29 (×8): 240 mL via ORAL
  Filled 2013-04-27: qty 480

## 2013-04-27 MED ORDER — NICOTINE 21 MG/24HR TD PT24
21.0000 mg | MEDICATED_PATCH | Freq: Every day | TRANSDERMAL | Status: DC
  Administered 2013-04-27 – 2013-05-03 (×7): 21 mg via TRANSDERMAL
  Filled 2013-04-27 (×9): qty 1
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
## 2013-04-27 NOTE — [facility] Suicide Risk Assessment (Signed)
Suicide Risk Assessment  Admission Assessment     Nursing information obtained from:    Demographic factors:    Current Mental Status:    Loss Factors:    Historical Factors:    Risk Reduction Factors:     CLINICAL FACTORS:   Depression:   Anhedonia Hopelessness Impulsivity Alcohol/Substance Abuse/Dependencies More than one psychiatric diagnosis Unstable or Poor Therapeutic Relationship Previous Psychiatric Diagnoses and Treatments  COGNITIVE FEATURES THAT CONTRIBUTE TO RISK:  Closed-mindedness Polarized thinking    SUICIDE RISK:   Moderate:  Frequent suicidal ideation with limited intensity, and duration, some specificity in terms of plans, no associated intent, good self-control, limited dysphoria/symptomatology, some risk factors present, and identifiable protective factors, including available and accessible social support.  PLAN OF CARE:  I certify that inpatient services furnished can reasonably be expected to improve the patient's condition.  Luca Burston 04/27/2013, 10:16 AM

## 2013-04-27 NOTE — Progress Notes (Signed)
Adult Psychoeducational Group Note  Date:  04/26/13 Time:  8:00 pm  Group Topic/Focus:  Wrap-Up Group:   The focus of this group is to help patients review their daily goal of treatment and discuss progress on daily workbooks.  Participation Level:  Did Not Attend  Devlon, Dosher 04/27/2013, 12:11 AM

## 2013-04-27 NOTE — [facility] Group Notes (Signed)
[facility] Group Notes:  (Nursing/MHT/Case Management/Adjunct)  Date:  04/27/2013  Time:  0930  Type of Therapy: Psychoeducational Skills with RN - Healthy Support Systems  Participation Level: Did Not Attend  Participation Quality: na  Affect: na  Cognitive: na  Insight: None  Engagement in Group: na  Modes of Intervention: na  Summary of Progress/Problems:   Malva Limes 04/27/2013, 2:50 PM
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
## 2013-04-27 NOTE — Progress Notes (Signed)
D. Pt has been up and active in milieu today, attending and participating in various milieu activities. Pt continues to have various signs and symptoms of withdrawal such as tremors, sweats, cramping and anxiety. Pt did speak briefly about the passing of his wife and how it led him to a downward spiral but that he wants to get clean and cope with his loss in a positive way. A. Support and encouragement provided, medication education provided. R. Pt verbalized understanding, will continue to monitor.

## 2013-04-27 NOTE — Progress Notes (Signed)
Patient did attend the evening speaker AA meeting. Pt reported feeling too bad from withdrawals to attend. Pt remained in bed for most of the meeting.

## 2013-04-27 NOTE — Progress Notes (Signed)
Patient ID: Ronald Robertson, male   DOB: Aug 12, 1960, 52 y.o.   MRN: [id number] Patient asleep; no s/s of distress noted at this time. Respirations regular and unlabored.

## 2013-04-27 NOTE — Progress Notes (Signed)
D   Pt is pleasant on approach and is cooperative  He complained of having poor sleep and a poor appetite   He reports a low energy level and having trouble paying attention and concentrating   Pt has some mild tremors and some pins and needles skin   He complains of having tremors chilling tremors and cravings   He denies suicidal ideation and has long term treatment already set up for him after his discharge from [facility]    A   Verbal support given  Discussed progress of detox with patient and what he could expect to feel like in the next two to three days  Medications administered and effectiveness monitored  Q 15 min checks R   Pt is safe and verbalized understanding of detox information

## 2013-04-27 NOTE — [facility] Group Notes (Signed)
[facility] Group Notes: (Clinical Social Work)   04/27/2013      Type of Therapy:  Group Therapy   Participation Level:  Did Not Attend    Ambrose Mantle, LCSW 04/27/2013, 12:42 PM

## 2013-04-28 DIAGNOSIS — F1994 Other psychoactive substance use, unspecified with psychoactive substance-induced mood disorder: Secondary | ICD-10-CM

## 2013-04-28 NOTE — Progress Notes (Signed)
D:Pt reports no change since prn Naproxen given for back and stomach. He does report that he has "been able to keep my lunch down." A:Offered ice/heat pack. Pt refused stating "I'm ok."  R:Pt is sitting in dayroom interacting with peers. Safety maintained.

## 2013-04-28 NOTE — Progress Notes (Signed)
Pt reports he is feeling very anxious right now and requests Vistaril at this time.  Pt states he is not having any other withdrawal symptoms at the moment.  Pt denies SI/HI/AV.  Pt says he has been abusing pain meds and wants to stop using them as he knows this is not good for him.  Pt says he wants long term treatment for his addictions.  Pt makes his needs known to staff.  Support and encouragement offered.  Safety maintained with q15 minute checks.

## 2013-04-28 NOTE — [facility] Suicide Risk Assessment (Signed)
[facility] INPATIENT: Family/Significant Other Suicide Prevention Education  Suicide Prevention Education:  Education Completed; No one has been identified by the patient as the family member/significant other with whom the patient will be residing, and identified as the person(s) who will aid the patient in the event of a mental health crisis (suicidal ideations/suicide attempt).   Pt did not c/o SI at admission, nor have they endorsed SI during their stay here. SPE not required. SPI pamphlet provided to pt and he was encouraged to ask questions, talk about concerns, and share information with his support network.   [employer], LCSWA 04/28/2013 3:03 PM

## 2013-04-28 NOTE — Progress Notes (Signed)
[facility] MD Progress Note  04/28/2013 4:28 PM Ronald Robertson  MRN:  [id number] Subjective:  Ronald Robertson continues to have a hard time. States that he is still feeling anxious, shaky, unable to focus. States it has never been this bad. He has been on Zoloft, Buspar after his father died. He has not been able to deal with the death of his wife a month agp. States he had to make the call of getting her disconnected from the respirator. Her illness and final days were very hard to watch. He tried to cope by drinking and using opioids but states it go to out of control and when he trid to stop he could not. Diagnosis:   DSM5: Schizophrenia Disorders:   Obsessive-Compulsive Disorders:   Trauma-Stressor Disorders:   Substance/Addictive Disorders:  Alcohol Related Disorder - Moderate (303.90) and Opioid Disorder - Moderate (304.00) Depressive Disorders:  Major Depressive Disorder - Moderate (296.22)  Axis I: Anxiety Disorder NOS and Substance Induced Mood Disorder  ADL's:  Intact  Sleep: Poor  Appetite:  Poor  Suicidal Ideation:  Plan:  denies Intent:  denies Means:  denies Homicidal Ideation:  Plan:  denies Intent:  denies Means:  denies AEB (as evidenced by):  Psychiatric Specialty Exam: Review of Systems  Constitutional: Negative.   HENT: Negative.   Eyes: Negative.   Respiratory: Negative.   Cardiovascular: Negative.   Gastrointestinal: Negative.   Genitourinary: Negative.   Musculoskeletal: Negative.   Skin: Negative.   Neurological: Negative.   Endo/Heme/Allergies: Negative.   Psychiatric/Behavioral: Positive for depression and substance abuse. The patient is nervous/anxious.     Blood pressure 139/74, pulse 111, temperature 97.8 F (36.6 C), temperature source Oral, resp. rate 21, height 5\' 11"  (1.803 m), weight 107.502 kg (237 lb), SpO2 95.00%.Body mass index is 33.07 kg/(m^2).  General Appearance: Fairly Groomed  Patent attorney::  Fair  Speech:  Clear and Coherent  Volume:   fluctuates  Mood:  Anxious and Depressed  Affect:  Tearful and anxious, sad  Thought Process:  Coherent and Goal Directed  Orientation:  Full (Time, Place, and Person)  Thought Content:  worries, concerns, thoughts of his wife, her illness and death  Suicidal Thoughts:  No  Homicidal Thoughts:  No  Memory:  Immediate;   Fair Recent;   Fair Remote;   Fair  Judgement:  Fair  Insight:  Present  Psychomotor Activity:  Restlessness  Concentration:  Fair  Recall:  Fair  Akathisia:  No  Handed:    AIMS (if indicated):     Assets:  Desire for Improvement  Sleep:  Number of Hours: 5.75   Current Medications: Current Facility-Administered Medications  Medication Dose Route Frequency Provider Last Rate Last Dose  . acetaminophen (TYLENOL) tablet 650 mg  650 mg Oral Q6H PRN Nanine Means, NP      . alum & mag hydroxide-simeth (MAALOX/MYLANTA) 200-200-20 MG/5ML suspension 30 mL  30 mL Oral Q4H PRN Nanine Means, NP      . busPIRone (BUSPAR) tablet 10 mg  10 mg Oral TID Nanine Means, NP   10 mg at 04/28/13 1145  . chlordiazePOXIDE (LIBRIUM) capsule 25 mg  25 mg Oral Q6H PRN Nanine Means, NP   25 mg at 04/27/13 1639  . chlordiazePOXIDE (LIBRIUM) capsule 25 mg  25 mg Oral TID Nanine Means, NP   25 mg at 04/28/13 1144   Followed by  . [START ON 04/29/2013] chlordiazePOXIDE (LIBRIUM) capsule 25 mg  25 mg Oral BH-qamhs Nanine Means, NP  Followed by  . [START ON 04/30/2013] chlordiazePOXIDE (LIBRIUM) capsule 25 mg  25 mg Oral Daily Nanine Means, NP      . cloNIDine (CATAPRES) tablet 0.1 mg  0.1 mg Oral QID Nanine Means, NP   0.1 mg at 04/28/13 1145   Followed by  . [START ON 04/29/2013] cloNIDine (CATAPRES) tablet 0.1 mg  0.1 mg Oral BH-qamhs Nanine Means, NP       Followed by  . [START ON 05/01/2013] cloNIDine (CATAPRES) tablet 0.1 mg  0.1 mg Oral QAC breakfast Nanine Means, NP      . dicyclomine (BENTYL) tablet 20 mg  20 mg Oral Q6H PRN Nanine Means, NP   20 mg at 04/27/13 2145  . gatorade  (BH)  240 mL Oral QID Fransisca Kaufmann, NP   240 mL at 04/28/13 1148  . hydrOXYzine (ATARAX/VISTARIL) tablet 25 mg  25 mg Oral Q6H PRN Nanine Means, NP   25 mg at 04/28/13 1008  . loperamide (IMODIUM) capsule 2-4 mg  2-4 mg Oral PRN Nanine Means, NP      . magnesium hydroxide (MILK OF MAGNESIA) suspension 30 mL  30 mL Oral Daily PRN Nanine Means, NP      . methocarbamol (ROBAXIN) tablet 500 mg  500 mg Oral Q8H PRN Nanine Means, NP   500 mg at 04/26/13 2139  . multivitamin with minerals tablet 1 tablet  1 tablet Oral Daily Nanine Means, NP   1 tablet at 04/28/13 0829  . naproxen (NAPROSYN) tablet 500 mg  500 mg Oral BID PRN Nanine Means, NP   500 mg at 04/28/13 1355  . nicotine (NICODERM CQ - dosed in mg/24 hours) patch 21 mg  21 mg Transdermal Daily Rachael Fee, MD   21 mg at 04/28/13 0837  . ondansetron (ZOFRAN-ODT) disintegrating tablet 4 mg  4 mg Oral Q6H PRN Nanine Means, NP   4 mg at 04/28/13 0828  . sertraline (ZOLOFT) tablet 50 mg  50 mg Oral Daily Nanine Means, NP   50 mg at 04/28/13 0830  . thiamine (B-1) injection 100 mg  100 mg Intramuscular Once Nanine Means, NP      . thiamine (VITAMIN B-1) tablet 100 mg  100 mg Oral Daily Nanine Means, NP   100 mg at 04/28/13 0829  . traZODone (DESYREL) tablet 100 mg  100 mg Oral QHS PRN Nanine Means, NP   100 mg at 04/28/13 0115    Lab Results:  Results for orders placed during the hospital encounter of 04/26/13 (from the past 48 hour(s))  COMPREHENSIVE METABOLIC PANEL     Status: Abnormal   Collection Time    04/27/13  7:22 PM      Result Value Range   Sodium 138  135 - 145 mEq/L   Potassium 4.0  3.5 - 5.1 mEq/L   Chloride 103  96 - 112 mEq/L   CO2 28  19 - 32 mEq/L   Glucose, Bld 116 (*) 70 - 99 mg/dL   BUN 11  6 - 23 mg/dL   Creatinine, Ser 1.61  0.50 - 1.35 mg/dL   Calcium 8.5  8.4 - 09.6 mg/dL   Total Protein 6.1  6.0 - 8.3 g/dL   Albumin 2.9 (*) 3.5 - 5.2 g/dL   AST 70 (*) 0 - 37 U/L   ALT 69 (*) 0 - 53 U/L   Alkaline Phosphatase 72   39 - 117 U/L   Total Bilirubin 0.7  0.3 - 1.2 mg/dL  GFR calc non Af Amer >90  >90 mL/min   GFR calc Af Amer >90  >90 mL/min   Comment: (NOTE)     The eGFR has been calculated using the CKD EPI equation.     This calculation has not been validated in all clinical situations.     eGFR's persistently <90 mL/min signify possible Chronic Kidney     Disease.     Performed at [facility]    Physical Findings: AIMS: Facial and Oral Movements Muscles of Facial Expression: None, normal Lips and Perioral Area: None, normal Jaw: None, normal Tongue: None, normal,Extremity Movements Upper (arms, wrists, hands, fingers): None, normal Lower (legs, knees, ankles, toes): None, normal, Trunk Movements Neck, shoulders, hips: None, normal, Overall Severity Severity of abnormal movements (highest score from questions above): None, normal Incapacitation due to abnormal movements: None, normal Patient's awareness of abnormal movements (rate only patient's report): No Awareness, Dental Status Current problems with teeth and/or dentures?: No Does patient usually wear dentures?: No  CIWA:  CIWA-Ar Total: 4 COWS:  COWS Total Score: 7  Treatment Plan Summary: Daily contact with patient to assess and evaluate symptoms and progress in treatment Medication management  Plan: Supportive approach/coping skills/relapse prevention           Grief-loss           Pursue detox           Optimize treatment with Zoloft/Buspar  Medical Decision Making Problem Points:  Review of psycho-social stressors (1) Data Points:  Review of medication regiment & side effects (2)  I certify that inpatient services furnished can reasonably be expected to improve the patient's condition.   Marthena Whitmyer A 04/28/2013, 4:28 PM

## 2013-04-28 NOTE — Progress Notes (Signed)
Adult Psychoeducational Group Note  Date:  04/28/2013 Time:  1:40 PM  Group Topic/Focus:  Self Care:   The focus of this group is to help patients understand the importance of self-care in order to improve or restore emotional, physical, spiritual, interpersonal, and financial health.  Participation Level:  Minimal  Participation Quality:  Resistant  Affect:  Depressed  Cognitive:  Appropriate  Insight: Appropriate  Engagement in Group:  Engaged  Modes of Intervention:  Activity and Discussion  Additional Comments:   Pt was encouraged to participate but wanted to observe. Pt was also asked was there anything he wanted to share. Pt was assured by writer he could discuss anything he needed to. In addition to completing the self-care worksheets pts watched a video "The Sober Life-My Behavior" as this relates to wellness and self-care. Pts engaged in discussion on how they could relate to the topics presented on the video.    Roanna Banning, [place] N 04/28/2013, 1:40 PM

## 2013-04-28 NOTE — Tx Team (Signed)
Interdisciplinary Treatment Plan Update (Adult)  Date: 04/28/2013 11:55 AM  Time Reviewed: Progress in Treatment:  Attending groups: Yes Participating in groups:  Yes Taking medication as prescribed: Yes  Tolerating medication: Yes  Family/Significant othe contact made: No. SPE not required for this pt.  Patient understands diagnosis: Yes, AEB seeking treatment for ETOH detox, opiate detox, and depression.  Discussing patient identified problems/goals with staff: Yes  Medical problems stabilized or resolved: Yes  Denies suicidal/homicidal ideation: Yes during admission, group, and self report.  Patient has not harmed self or Others: Yes  New problem(s) identified: n/a  Discharge Plan or Barriers:  According to pt, he has placement with [facility]([place]) once detox is complete Additional comments: Ronald Robertson is a 52 y.o. male who presents to emerg dept for detox from alcohol and opiates(oxycodone). Pt denies SI/HI/Psych. Pt says he drinks 24-30 beers daily, last drink was 04/25/13, pt consumed 26-27 beers. Pt is prescribed oxycodone due to surgery(ulcer). Pt admits to abusing his medications, taking 15-20 5mg  pills, daily; last use was 2 days ago. Pt used 15-5mg  pills. Pt denies any seizure activity, does experience blackouts. Pt says he's not been able to eat in 5 days and c/o w/d sxs: shakes, anxiety, stomach pain, and nausea/vomitting. Pt has previous detox admissions with [phone]) and [facility](2003). Reason for Continuation of Hospitalization:  Librium taper/Clonidine taper-withdrawals Medication management Mood stabilization Estimated length of stay: 3-5 days  For review of initial/current patient goals, please see plan of care.  Attendees:  Patient:    Family:    Physician: Geoffery Lyons MD 04/28/2013 11:54 AM   Nursing: Alease Frame RN  04/28/2013 11:54 AM   Clinical Social Worker Amilia Vandenbrink Smart, LCSWA  04/28/2013 11:54 AM   Other: Quintella Reichert, RN    Other:     Other: Massie Kluver, Community Care Coordinator  04/28/2013 11:54 AM   Other:    Scribe for Treatment Team:  [employer] LCSWA 04/28/2013 11:54 AM

## 2013-04-28 NOTE — Progress Notes (Signed)
Attended AA group 

## 2013-04-28 NOTE — Clinical Social Work Note (Signed)
CSW contacted Valene Bors, pt's friend, after consent signed by pt. Edwin and CSW talked about pt's d/c plan. Pt has follow up with Urgent Care PCP on Tues Oct 21st, but was wanting to work with Dorma Russell after d/c to make any big decisions regarding aftercare. Pt stated that he was accepted into [facility] but did not want to go at this time. Pt's friend, Dorma Russell was given CSW's contact information and encouraged to call if they needed help with resources/services after pt d/ced. Pt currently unwiling to consent to further referrals at this time other than PCP but was open to getting information to [facility] in [place], [place]. CSW provided pt with this information and encouraged him to call.

## 2013-04-28 NOTE — [facility] Group Notes (Signed)
[facility] LCSW Group Therapy  04/28/2013 4:31 PM  Type of Therapy:  Group Therapy  Participation Level:  Did Not Attend  Smart, Bayleigh Loflin 04/28/2013, 4:31 PM  

## 2013-04-28 NOTE — Progress Notes (Signed)
D:Pt reports anxiety with stomach and lower back pain. Pt's pulse was increased this morning. He assisted another pt that lost her balance while waiting on medication. Reported to NP.   A:Offered support and encouragement. Gave prn medication as needed. Offered prn pain medication and pt refused. Offered warm pack for lower back discomfort and pt responded "maybe later." Rechecked blood pressure and pulse after medication given. Pulse 102. R:Pt denies si and hi. Safety maintained on the unit.

## 2013-04-28 NOTE — [facility] Group Notes (Signed)
[facility] LCSW Aftercare Discharge Planning Group Note   04/28/2013 10:16 AM  Participation Quality:  Appropriate   Mood/Affect:  Tearful  Depression Rating:  4  Anxiety Rating:  10  Thoughts of Suicide:  No Will you contract for safety?   NA  Current AVH:  No  Plan for Discharge/Comments:  Pt stated that his wife recently passed away. He has been drinking heavily and taking 20 oxycodone pills daily. Pt reports that his PCP at [facility], [place] prescribes anxiety/depression meds and he does not see therapist currently. Pt reports that he has two close friends that are helping him determine his next step after detox and that he sees his pastor for grief counseling. Pt stated that he feels very anxious and unstable currently. Moderate withdrawals 6/10.   Transportation Means: friend   Supports: Building control surveyor, Research scientist (physical sciences)

## 2013-04-29 MED ORDER — GABAPENTIN 100 MG PO CAPS
100.0000 mg | ORAL_CAPSULE | Freq: Three times a day (TID) | ORAL | Status: DC
  Administered 2013-04-29 – 2013-04-30 (×3): 100 mg via ORAL
  Filled 2013-04-29 (×10): qty 1

## 2013-04-29 NOTE — Progress Notes (Signed)
[facility] MD Progress Note  04/29/2013 5:28 PM Ronald Robertson  MRN:  [id number] Subjective:  Still very anxious, depressed, labile tearful. As he pursues the detox, he is having a hard time dealing with the feelings, the loss of his wife. Admits he was self medicating and now he is in touch with  a lot of sadness, anxiety and has a hard time coping. He is hyper focused on the medications he is taking, when can he take them, how can they help, etc. . He is needing a lot of reassurance.  Diagnosis:   DSM5: Schizophrenia Disorders:   Obsessive-Compulsive Disorders:   Trauma-Stressor Disorders:   Substance/Addictive Disorders:  Alcohol Related Disorder - Severe (303.90) and Opioid Disorder - Severe (304.00) Depressive Disorders:  Major Depressive Disorder - Severe (296.23)  Axis I: Anxiety Disorder NOS  ADL's:  Intact  Sleep: Fair  Appetite:  Fair  Suicidal Ideation:  Plan:  denies Intent:  denies Means:  denies Homicidal Ideation:  Plan:  denies Intent:  denies Means:  denies AEB (as evidenced by):  Psychiatric Specialty Exam: Review of Systems  Constitutional: Negative.   HENT: Negative.   Eyes: Negative.   Respiratory: Negative.   Cardiovascular: Negative.   Gastrointestinal: Negative.   Genitourinary: Negative.   Musculoskeletal: Positive for back pain.  Skin: Negative.   Neurological: Negative.   Endo/Heme/Allergies: Negative.   Psychiatric/Behavioral: Positive for depression and substance abuse. The patient is nervous/anxious and has insomnia.     Blood pressure 116/82, pulse 92, temperature 96.9 F (36.1 C), temperature source Oral, resp. rate 18, height 5\' 11"  (1.803 m), weight 107.502 kg (237 lb), SpO2 95.00%.Body mass index is 33.07 kg/(m^2).  General Appearance: Fairly Groomed  Patent attorney::  Fair  Speech:  Clear and Coherent  Volume:  Decreased  Mood:  Anxious, Depressed and Dysphoric  Affect:  Tearful and anxious, depressed  Thought Process:  Coherent and  Goal Directed  Orientation:  Full (Time, Place, and Person)  Thought Content:  worries, concerns, symtpoms, the loss of his wife  Suicidal Thoughts:  No  Homicidal Thoughts:  No  Memory:  Immediate;   Fair Recent;   Fair Remote;   Fair  Judgement:  Fair  Insight:  Shallow  Psychomotor Activity:  Restlessness  Concentration:  Fair  Recall:  Fair  Akathisia:  No  Handed:    AIMS (if indicated):     Assets:  Desire for Improvement Social Support  Sleep:  Number of Hours: 6   Current Medications: Current Facility-Administered Medications  Medication Dose Route Frequency Provider Last Rate Last Dose  . acetaminophen (TYLENOL) tablet 650 mg  650 mg Oral Q6H PRN Nanine Means, NP      . alum & mag hydroxide-simeth (MAALOX/MYLANTA) 200-200-20 MG/5ML suspension 30 mL  30 mL Oral Q4H PRN Nanine Means, NP      . busPIRone (BUSPAR) tablet 10 mg  10 mg Oral TID Nanine Means, NP   10 mg at 04/29/13 1705  . chlordiazePOXIDE (LIBRIUM) capsule 25 mg  25 mg Oral BH-qamhs Nanine Means, NP   25 mg at 04/29/13 0800   Followed by  . [START ON 04/30/2013] chlordiazePOXIDE (LIBRIUM) capsule 25 mg  25 mg Oral Daily Nanine Means, NP      . cloNIDine (CATAPRES) tablet 0.1 mg  0.1 mg Oral BH-qamhs Nanine Means, NP   0.1 mg at 04/29/13 0800   Followed by  . [START ON 05/01/2013] cloNIDine (CATAPRES) tablet 0.1 mg  0.1 mg Oral QAC breakfast  Nanine Means, NP      . dicyclomine (BENTYL) tablet 20 mg  20 mg Oral Q6H PRN Nanine Means, NP   20 mg at 04/27/13 2145  . gabapentin (NEURONTIN) capsule 100 mg  100 mg Oral TID Rachael Fee, MD   100 mg at 04/29/13 1705  . hydrOXYzine (ATARAX/VISTARIL) tablet 25 mg  25 mg Oral Q6H PRN Rachael Fee, MD   25 mg at 04/29/13 1257  . magnesium hydroxide (MILK OF MAGNESIA) suspension 30 mL  30 mL Oral Daily PRN Nanine Means, NP      . methocarbamol (ROBAXIN) tablet 500 mg  500 mg Oral Q8H PRN Nanine Means, NP   500 mg at 04/26/13 2139  . multivitamin with minerals tablet 1  tablet  1 tablet Oral Daily Nanine Means, NP   1 tablet at 04/29/13 0800  . naproxen (NAPROSYN) tablet 500 mg  500 mg Oral BID PRN Nanine Means, NP   500 mg at 04/28/13 1355  . nicotine (NICODERM CQ - dosed in mg/24 hours) patch 21 mg  21 mg Transdermal Daily Rachael Fee, MD   21 mg at 04/29/13 0800  . sertraline (ZOLOFT) tablet 50 mg  50 mg Oral Daily Nanine Means, NP   50 mg at 04/29/13 0800  . thiamine (B-1) injection 100 mg  100 mg Intramuscular Once Nanine Means, NP      . thiamine (VITAMIN B-1) tablet 100 mg  100 mg Oral Daily Nanine Means, NP   100 mg at 04/29/13 0800  . traZODone (DESYREL) tablet 100 mg  100 mg Oral QHS PRN Nanine Means, NP   100 mg at 04/28/13 2150    Lab Results:  Results for orders placed during the hospital encounter of 04/26/13 (from the past 48 hour(s))  COMPREHENSIVE METABOLIC PANEL     Status: Abnormal   Collection Time    04/27/13  7:22 PM      Result Value Range   Sodium 138  135 - 145 mEq/L   Potassium 4.0  3.5 - 5.1 mEq/L   Chloride 103  96 - 112 mEq/L   CO2 28  19 - 32 mEq/L   Glucose, Bld 116 (*) 70 - 99 mg/dL   BUN 11  6 - 23 mg/dL   Creatinine, Ser 2.13  0.50 - 1.35 mg/dL   Calcium 8.5  8.4 - 08.6 mg/dL   Total Protein 6.1  6.0 - 8.3 g/dL   Albumin 2.9 (*) 3.5 - 5.2 g/dL   AST 70 (*) 0 - 37 U/L   ALT 69 (*) 0 - 53 U/L   Alkaline Phosphatase 72  39 - 117 U/L   Total Bilirubin 0.7  0.3 - 1.2 mg/dL   GFR calc non Af Amer >90  >90 mL/min   GFR calc Af Amer >90  >90 mL/min   Comment: (NOTE)     The eGFR has been calculated using the CKD EPI equation.     This calculation has not been validated in all clinical situations.     eGFR's persistently <90 mL/min signify possible Chronic Kidney     Disease.     Performed at [facility]    Physical Findings: AIMS: Facial and Oral Movements Muscles of Facial Expression: None, normal Lips and Perioral Area: None, normal Jaw: None, normal Tongue: None, normal,Extremity  Movements Upper (arms, wrists, hands, fingers): None, normal Lower (legs, knees, ankles, toes): None, normal, Trunk Movements Neck, shoulders, hips: None, normal, Overall Severity  Severity of abnormal movements (highest score from questions above): None, normal Incapacitation due to abnormal movements: None, normal Patient's awareness of abnormal movements (rate only patient's report): No Awareness, Dental Status Current problems with teeth and/or dentures?: No Does patient usually wear dentures?: No  CIWA:  CIWA-Ar Total: 2 COWS:  COWS Total Score: 5  Treatment Plan Summary: Daily contact with patient to assess and evaluate symptoms and progress in treatment Medication management  Plan: Supportive approach/coping skills/relaspe prevention/mindfulness           Complete the detox/continue to reassess the co morbidities           Grief loss            Add Neurontin 100 mg TID  Medical Decision Making Problem Points:  Review of psycho-social stressors (1) Data Points:  Review of medication regiment & side effects (2) Review of new medications or change in dosage (2)  I certify that inpatient services furnished can reasonably be expected to improve the patient's condition.   Angelgabriel Willmore A 04/29/2013, 5:28 PM

## 2013-04-29 NOTE — Progress Notes (Signed)
Pt is at the med window requesting medication for anxiety.  He begins by asking for Librium, but has difficulty recalling how to pronounce it.  Informed pt that the prn Librium had expired and was no longer available.  Pt then said the previous nurse had told him he could have a medication at 1900.  Pt informed that Vistaril was available.  Pt was given the Vistaril, then pt wanted to know what he would be taking at bedtime and it he had something for sleep.  Tried to encourage pt to decrease his dependence on medication and use relaxation and coping skills that he had learned in groups.  Praised pt for only taking (1) sleep aid last night and not returning for the repeat dose.  Pt denies SI/HI/AV.  Other than the anxiety, pt reports minimal withdrawal symptoms.  Pt's eyes look brighter this evening and not as red as last night.  Pt makes his needs known to staff.  Support and encouragement offered.  Safety maintained with q15 minute checks.

## 2013-04-29 NOTE — [facility] Group Notes (Signed)
[facility] LCSW Group Therapy  04/29/2013 1:31 PM  Type of Therapy:  Group Therapy  Participation Level:  Active  Participation Quality:  Attentive  Affect:  Appropriate  Cognitive:  Alert and Oriented  Insight:  Engaged  Engagement in Therapy:  Engaged  Modes of Intervention:  Discussion, Education, Socialization and Support  Summary of Progress/Problems: [facility] Speaker came to talk about his personal journey with substance abuse and addiction. The pt processed ways by which to relate to the speaker. [facility] speaker provided handouts and educational information pertaining to groups and services offered by the [facility]. Ronald Robertson was attentive and engaged throughout today's group. Ronald Robertson shows progress in the group setting AEB his ability to actively listen as speaker shared his personal story. Ronald Robertson thanked the speaker for sharing his story. He asked questions about groups offered by [facility].   Ronald Robertson, Ronald Robertson 04/29/2013, 1:31 PM

## 2013-04-29 NOTE — Progress Notes (Signed)
Pt attended A/A 

## 2013-04-29 NOTE — Progress Notes (Signed)
Adult Psychoeducational Group Note  Date:  04/29/2013 Time:  2:52 PM  Group Topic/Focus:  Recovery Goals:   The focus of this group is to identify appropriate goals for recovery and establish a plan to achieve them.  Participation Level:  Active  Participation Quality:  Appropriate and Sharing  Affect:  Appropriate  Cognitive:  Appropriate  Insight: Appropriate and Good  Engagement in Group:  Engaged and Supportive  Modes of Intervention:  Discussion, Education and Support  Additional Comments:  Pt stated he needed to add more support and communicate with others when he is feeling bad or having a bad day. Pt stated he ultimately needed to start loving himself.   Caswell Corwin 04/29/2013, 2:52 PM

## 2013-04-29 NOTE — Progress Notes (Signed)
Recreation Therapy Notes  Date: 10.14.2014 Time: 2:30pm Location: [address] Dayroom  Group Topic: Software engineer Activities (AAA)  Behavioral Response: Engaged, Attentive  Affect: Euthymic  Clinical Observations/Feedback: Dog Team: Education officer, museum. Patient interacted appropriately with peer, dog team, LRT and MHT.   Marykay Lex Kylee Nardozzi, LRT/CTRS  Amora Sheehy L 04/29/2013 4:06 PM

## 2013-04-29 NOTE — Progress Notes (Signed)
D:  Patient up and well groomed.  Visible in the milieu and in groups.  Appears very anxious.  Frequently at the window asking for medications or when he can have various medicines.  Tearful at times when talking about his wife.   A:  Medications given as prescribed.  Educated about various medications and how to take them.  Encouraged him to use other modalities besides medication all the time to decrease his anxiety.  Also encouraged him to speak with a member of the social work team for advice.  Allowed patient to vent and to talk about his wife.   R:  Cooperative with staff.  Medications effective.  Verbalized understanding of all education.  Interacting well with peers.  Interactive in groups.  Safety is maintained.

## 2013-04-29 NOTE — Progress Notes (Signed)
Recreation Therapy Notes  Date: 10.14.2014 Time: 3:00pm Location: [address] Dayroom  Group Topic: Communication, Team Building, Problem Solving  Goal Area(s) Addresses:  Patient will effectively work with peer towards shared goal.  Patient will identify skill used to make activity successful.  Patient will identify how skills used during activity can be used to reach post d/c goals.   Behavioral Response: Engaged, Attentive, Apporpriate  Intervention: Problem Solving Activitiy  Activity: Life Boat. Patients were given a scenario about being on a sinking yacht. Patients were informed the yacht included 15 guest, 8 of which could be placed on the life boat, along with all group members. Individuals on guest list were of varying socioeconomic classes such as a Education officer, museum, Materials engineer, Midwife, Tree surgeon.   Education: Customer service manager, Discharge Planning   Education Outcome: Acknowledges understanding  Clinical Observations/Feedback: Patient actively engaged in activity, voicing his opinion and debating appropriately with group members. Patient additionally identified team work and communication as skills necessary to make activity successful. Patient identified importance of using skills used in group session to facilitate recovery.    Marykay Lex Eshal Propps, LRT/CTRS  Shellene Sweigert L 04/29/2013 5:00 PM

## 2013-04-30 DIAGNOSIS — F329 Major depressive disorder, single episode, unspecified: Secondary | ICD-10-CM | POA: Diagnosis present

## 2013-04-30 DIAGNOSIS — F1114 Opioid abuse with opioid-induced mood disorder: Secondary | ICD-10-CM | POA: Diagnosis present

## 2013-04-30 DIAGNOSIS — F1010 Alcohol abuse, uncomplicated: Secondary | ICD-10-CM | POA: Diagnosis present

## 2013-04-30 DIAGNOSIS — Z634 Disappearance and death of family member: Secondary | ICD-10-CM

## 2013-04-30 MED ORDER — GABAPENTIN 100 MG PO CAPS
200.0000 mg | ORAL_CAPSULE | Freq: Three times a day (TID) | ORAL | Status: DC
  Administered 2013-04-30 – 2013-05-02 (×6): 200 mg via ORAL
  Filled 2013-04-30 (×8): qty 2

## 2013-04-30 MED ORDER — TRAZODONE HCL 100 MG PO TABS
200.0000 mg | ORAL_TABLET | Freq: Every evening | ORAL | Status: DC | PRN
  Administered 2013-04-30 – 2013-05-03 (×3): 200 mg via ORAL
  Filled 2013-04-30 (×3): qty 2

## 2013-04-30 MED ORDER — SERTRALINE HCL 25 MG PO TABS
75.0000 mg | ORAL_TABLET | Freq: Every day | ORAL | Status: DC
Start: 2013-05-01 — End: 2013-05-01
  Administered 2013-05-01: 75 mg via ORAL
  Filled 2013-04-30 (×2): qty 3

## 2013-04-30 NOTE — [facility] Group Notes (Signed)
[facility] LCSW Group Therapy  04/30/2013 3:26 PM  Type of Therapy:  Group Therapy  Participation Level:  Active  Participation Quality:  Attentive  Affect:  Depressed and Tearful  Cognitive:  Alert and Oriented  Insight:  Engaged  Engagement in Therapy:  Engaged  Modes of Intervention:  Confrontation, Discussion, Education, Exploration, Socialization and Support  Summary of Progress/Problems: Emotion Regulation: This group focused on both positive and negative emotion identification and allowed group members to process ways to identify feelings, regulate negative emotions, and find healthy ways to manage internal/external emotions. Group members were asked to reflect on a time when their reaction to an emotion led to a negative outcome and explored how alternative responses using emotion regulation would have benefited them. Group members were also asked to discuss a time when emotion regulation was utilized when a negative emotion was experienced. Ronald Robertson was attentive and engaged throughout today's group. He identified "anxiety and grief" as the two emotions that he is presently feeling and as the two emotions that he has been having the most trouble managing lately. Ronald Robertson explained that he had been using alcohol and pain killers to "numb the pain of my wife's death and deal with the intense anxiety that I've been dealing with for years" Ronald Robertson acknowledged that now that he is detoxing, he must face these emotions and "allow myself to grieve." Ronald Robertson shows progress in the group setting and improving insight AEB his ability to identify negative emotions that he finds difficult to manage, explore how these emotions feel to him both physically and mentally, and identify positive coping skills for better managing these emotions in the future. "I plan to go to grief counseling through [facility] in order to work my way through the grief cycle and find closure, surround myself with supportive people in my life, and  learn to ASK FOR HELP!" Ronald Robertson explored the reasons why his pride had gotten in the way of reaching out for help in the past and explained that he finally has become humbled and is willing and able to ask for help when needed.    Smart, [name] 04/30/2013, 3:26 PM

## 2013-04-30 NOTE — Progress Notes (Signed)
[facility] MD Progress Note  04/30/2013 5:08 PM Ronald Robertson  MRN:  [id number] Subjective:  Sill feeling anxious, depressed, tearful/. States that he thinks most of his withdrawal is over with. He had tachycardia going on earlier today. He is still dealing with Robertson lot of emotion. He is having thoughts about his wife, states it helps to talk  In group but he feels embarrassed as he starts crying when he does. He understands that he is grieving but that he had also been dealing with depression and anxiety even before she died. He did not sleep well. He goes to sleep with the first Trazodone but he wakes up and he cant get the second one as it is too late Diagnosis:   DSM5: Schizophrenia Disorders:   Obsessive-Compulsive Disorders:   Trauma-Stressor Disorders:   Substance/Addictive Disorders:  Alcohol Related Disorder - Severe (303.90) and Opioid Disorder - Severe (304.00) Depressive Disorders:  Major Depressive Disorder - Severe (296.23)  Axis I: Anxiety Disorder NOS  ADL's:  Intact  Sleep: Fair  Appetite:  Fair  Suicidal Ideation:  Plan:  denies Intent:  denies Means:  denies Homicidal Ideation:  Plan:  denies Intent:  denies Means:  denies AEB (as evidenced by):  Psychiatric Specialty Exam: Review of Systems  Constitutional: Negative.   HENT: Negative.   Eyes: Negative.   Respiratory: Negative.   Cardiovascular: Negative.   Gastrointestinal: Negative.   Genitourinary: Negative.   Musculoskeletal: Negative.   Skin: Negative.   Neurological: Negative.   Endo/Heme/Allergies: Negative.   Psychiatric/Behavioral: Positive for depression and substance abuse. The patient is nervous/anxious.     Blood pressure 120/84, pulse 105, temperature 99.2 F (37.3 C), temperature source Oral, resp. rate 18, height 5\' 11"  (1.803 m), weight 107.502 kg (237 lb), SpO2 95.00%.Body mass index is 33.07 kg/(m^2).  General Appearance: Fairly Groomed  Patent attorney::  Fair  Speech:  Clear and Coherent,  Slow and not spontaneous  Volume:  fluctuates  Mood:  Anxious and Depressed  Affect:  Tearful and sad, anxious  Thought Process:  Coherent and Goal Directed  Orientation:  Full (Time, Place, and Person)  Thought Content:  worries, concerns, his loss, his grief  Suicidal Thoughts:  No  Homicidal Thoughts:  No  Memory:  Immediate;   Fair Recent;   Fair Remote;   Fair  Judgement:  Fair  Insight:  Present  Psychomotor Activity:  Restlessness  Concentration:  Poor  Recall:  Poor  Akathisia:  No  Handed:    AIMS (if indicated):     Assets:  Desire for Improvement Social Support  Sleep:  Number of Hours: 4.75   Current Medications: Current Facility-Administered Medications  Medication Dose Route Frequency Provider Last Rate Last Dose  . acetaminophen (TYLENOL) tablet 650 mg  650 mg Oral Q6H PRN Nanine Means, NP      . alum & mag hydroxide-simeth (MAALOX/MYLANTA) 200-200-20 MG/5ML suspension 30 mL  30 mL Oral Q4H PRN Nanine Means, NP      . busPIRone (BUSPAR) tablet 10 mg  10 mg Oral TID Nanine Means, NP   10 mg at 04/30/13 1654  . cloNIDine (CATAPRES) tablet 0.1 mg  0.1 mg Oral BH-qamhs Nanine Means, NP   0.1 mg at 04/30/13 6213   Followed by  . [START ON 05/01/2013] cloNIDine (CATAPRES) tablet 0.1 mg  0.1 mg Oral QAC breakfast Nanine Means, NP      . dicyclomine (BENTYL) tablet 20 mg  20 mg Oral Q6H PRN Nanine Means, NP  20 mg at 04/27/13 2145  . gabapentin (NEURONTIN) capsule 200 mg  200 mg Oral TID Rachael Fee, MD   200 mg at 04/30/13 1654  . hydrOXYzine (ATARAX/VISTARIL) tablet 25 mg  25 mg Oral Q6H PRN Rachael Fee, MD   25 mg at 04/30/13 1257  . magnesium hydroxide (MILK OF MAGNESIA) suspension 30 mL  30 mL Oral Daily PRN Nanine Means, NP      . methocarbamol (ROBAXIN) tablet 500 mg  500 mg Oral Q8H PRN Nanine Means, NP   500 mg at 04/30/13 1417  . multivitamin with minerals tablet 1 tablet  1 tablet Oral Daily Nanine Means, NP   1 tablet at 04/30/13 0865  . naproxen  (NAPROSYN) tablet 500 mg  500 mg Oral BID PRN Nanine Means, NP   500 mg at 04/28/13 1355  . nicotine (NICODERM CQ - dosed in mg/24 hours) patch 21 mg  21 mg Transdermal Daily Rachael Fee, MD   21 mg at 04/30/13 0847  . [START ON 05/01/2013] sertraline (ZOLOFT) tablet 75 mg  75 mg Oral Daily Rachael Fee, MD      . thiamine (B-1) injection 100 mg  100 mg Intramuscular Once Nanine Means, NP      . thiamine (VITAMIN B-1) tablet 100 mg  100 mg Oral Daily Nanine Means, NP   100 mg at 04/30/13 0814  . traZODone (DESYREL) tablet 200 mg  200 mg Oral QHS PRN Rachael Fee, MD        Lab Results: No results found for this or any previous visit (from the past 48 hour(s)).  Physical Findings: AIMS: Facial and Oral Movements Muscles of Facial Expression: None, normal Lips and Perioral Area: None, normal Jaw: None, normal Tongue: None, normal,Extremity Movements Upper (arms, wrists, hands, fingers): None, normal Lower (legs, knees, ankles, toes): None, normal, Trunk Movements Neck, shoulders, hips: None, normal, Overall Severity Severity of abnormal movements (highest score from questions above): None, normal Incapacitation due to abnormal movements: None, normal Patient's awareness of abnormal movements (rate only patient's report): No Awareness, Dental Status Current problems with teeth and/or dentures?: No Does patient usually wear dentures?: No  CIWA:  CIWA-Ar Total: 8 COWS:  COWS Total Score: 5  Treatment Plan Summary: Daily contact with patient to assess and evaluate symptoms and progress in treatment Medication management  Plan: Supportive approach/coping skills/relapse prevention           Will increase the Neurontin to 200 mg TID                                          Zoloft 75 mg daily                                         Trazodone to 200 mg HS                                         CBT/grief loss  Medical Decision Making Problem Points:  Review of last therapy session (1)  and Review of psycho-social stressors (1) Data Points:  Review of medication regiment & side effects (2) Review of new medications or change in dosage (2)  I certify that inpatient services furnished can reasonably be expected to improve the patient's condition.   Ronald Robertson 04/30/2013, 5:08 PM

## 2013-04-30 NOTE — [facility] Group Notes (Signed)
[facility] LCSW Aftercare Discharge Planning Group Note   04/30/2013 9:46 AM  Participation Quality:  Appropriate   Mood/Affect:  Anxious   Depression Rating:  2  Anxiety Rating:  10  Thoughts of Suicide:  No Will you contract for safety?   NA  Current AVH:  No  Plan for Discharge/Comments:  Pt has appt with PCP and asked that he and his friends make any other followup. CSW informed pt that contact made with pt's friend yesterday and CSW offered assistance with resources. Pt was thankful for help and stated that MD put him on Neurontin 3x daily. Pt hoping that this will help lessen his anxiety. Pt reporting mild withdrawal symptoms at this time.   Transportation Means: friend   Supports: friends   Counselling psychologist, Research scientist (physical sciences)

## 2013-04-30 NOTE — Progress Notes (Signed)
Attended NA Meeting 

## 2013-04-30 NOTE — Progress Notes (Signed)
D: Patient denies SI/HI/AVH. Patient has a extremely anxious mood/affect.  He appears to be fixated on medications ordered and administration times.  Pt. Asking same questions repeatedly about meds/doses/times.  A: Patient given emotional support from RN. Patient encouraged to come to staff with concerns and/or questions. Patient's medication routine continued. Patient's orders and plan of care reviewed.   R: Patient remains appropriate and cooperative. Will continue to monitor patient q15 minutes for safety.Pt. Cooperative and interacting well with others.

## 2013-04-30 NOTE — Tx Team (Signed)
Interdisciplinary Treatment Plan Update (Adult)  Date: 04/30/2013 11:24 AM  Time Reviewed: Progress in Treatment:  Attending groups: Yes Participating in groups:  Yes Taking medication as prescribed: Yes  Tolerating medication: Yes  Family/Significant othe contact made: Yes. Contact made with pt's friend and primary support, Dorma Russell to discuss follow up/provide resources. SPE not required for this pt.  Patient understands diagnosis: Yes, AEB seeking treatment for ETOH detox, opiate detox, and depression.  Discussing patient identified problems/goals with staff: Yes  Medical problems stabilized or resolved: Yes  Denies suicidal/homicidal ideation: Yes during admission, group, and self report.  Patient has not harmed self or Others: Yes  New problem(s) identified: n/a  Discharge Plan or Barriers:  Pt has appt scheduled with PCP and CSW has been in contact with pt's friend, Dorma Russell about d/c plans. Pt and Dorma Russell will decide upon further treatment after d/c and CSW provided both with resources, including [facility] grief counseling contact info.  Additional comments: Collis reports that MD put him on new medication (nuerontin) to assist with decreasing his high anxiety and is hoping to feel change in next few days.  Reason for Continuation of Hospitalization:  Clonidine taper-withdrawals Medication management Mood stabilization Estimated length of stay: 2-4 days   For review of initial/current patient goals, please see plan of care.  Attendees:  Patient:    Family:    Physician: Geoffery Lyons MD 04/30/2013 11:24 AM   Nursing: Lupita Leash RN 04/30/2013 11:24 AM   Clinical Social Worker Iwao Shamblin Smart, LCSWA  04/30/2013 11:24 AM   Other: Cicero Duck RN   Other:    Other: Darden Dates Nurse CM  04/30/2013 11:24 AM   Other:    Scribe for Treatment Team:  [employer] LCSWA 04/30/2013 11:24 AM

## 2013-05-01 MED ORDER — SERTRALINE HCL 100 MG PO TABS
100.0000 mg | ORAL_TABLET | Freq: Every day | ORAL | Status: DC
  Administered 2013-05-02 – 2013-05-03 (×2): 100 mg via ORAL
  Filled 2013-05-01 (×3): qty 1

## 2013-05-01 NOTE — Progress Notes (Signed)
Adult Psychoeducational Group Note  Date:  05/01/2013 Time:  9:54 AM  Group Topic/Focus:  Orientation:   The focus of this group is to educate the patient on the purpose and policies of crisis stabilization and provide a format to answer questions about their admission.  The group details unit policies and expectations of patients while admitted.  Participation Level:  Active  Participation Quality:  Appropriate and Sharing  Affect:  Appropriate and Tearful  Cognitive:  Appropriate  Insight: Appropriate and Good  Engagement in Group:  Engaged  Modes of Intervention:  Orientation  Additional Comments:  Pt participated in the morning exercises with the group. Pt became tearful and stated how true it is that things you have done 20 years ago can really effect you. Pt stated his goal for today is to take things day by day.  Caswell Corwin 05/01/2013, 9:54 AM

## 2013-05-01 NOTE — Progress Notes (Signed)
[facility] MD Progress Note  05/01/2013 5:14 PM KACIN DANCY  MRN:  [id number] Subjective:  Did sleep better last night. Still dealing with the loss of his wife States that is getting easier to talk without crying. States that he is trying to take it easy and allow himself time to grief.He recognizes that the way he was dealing wit the loss was not healthy. He is going to ask friends to help him dispose of this wife's things that he has not touched at the house. Two of his peers from [place] are needing a place to stay as their contract is not being renewed and he is considering offering that they move in. They are very supportive of him. He is trying to visualize being able to move forward and how a life without his wife could be.  Diagnosis:   DSM5: Schizophrenia Disorders:   Obsessive-Compulsive Disorders:   Trauma-Stressor Disorders:   Substance/Addictive Disorders:  Alcohol Related Disorder - Severe (303.90) and Opioid Disorder - Severe (304.00) Depressive Disorders:  Major Depressive Disorder - Severe (296.23)  Axis I: Anxiety Disorder NOS  ADL's:  Intact  Sleep: Fair  Appetite:  Fair  Suicidal Ideation:  Plan:  denies Intent:  denies Means:  denies Homicidal Ideation:  Plan:  denies Intent:  denies Means:  denies AEB (as evidenced by):  Psychiatric Specialty Exam: Review of Systems  Constitutional: Negative.   HENT: Negative.   Eyes: Negative.   Respiratory: Negative.   Cardiovascular: Negative.   Gastrointestinal: Negative.   Genitourinary: Negative.   Musculoskeletal: Negative.   Skin: Negative.   Neurological: Negative.   Endo/Heme/Allergies: Negative.   Psychiatric/Behavioral: Positive for depression and substance abuse. The patient is nervous/anxious and has insomnia.     Blood pressure 114/79, pulse 151, temperature 98.9 F (37.2 C), temperature source Oral, resp. rate 22, height 5\' 11"  (1.803 m), weight 107.502 kg (237 lb), SpO2 95.00%.Body mass index is 33.07  kg/(m^2).  General Appearance: Fairly Groomed  Patent attorney::  Fair  Speech:  Clear and Coherent and Slow  Volume:  Decreased  Mood:  Depressed  Affect:  Restricted and Tearful  Thought Process:  Coherent and Goal Directed  Orientation:  Full (Time, Place, and Person)  Thought Content:  worries, concerns, loss of his wife, his coping  Suicidal Thoughts:  No  Homicidal Thoughts:  No  Memory:  Immediate;   Fair Recent;   Fair Remote;   Fair  Judgement:  Fair  Insight:  Present  Psychomotor Activity:  Restlessness  Concentration:  Fair  Recall:  Fair  Akathisia:  No  Handed:    AIMS (if indicated):     Assets:  Desire for Improvement Social Support  Sleep:  Number of Hours: 6   Current Medications: Current Facility-Administered Medications  Medication Dose Route Frequency Provider Last Rate Last Dose  . acetaminophen (TYLENOL) tablet 650 mg  650 mg Oral Q6H PRN Nanine Means, NP      . alum & mag hydroxide-simeth (MAALOX/MYLANTA) 200-200-20 MG/5ML suspension 30 mL  30 mL Oral Q4H PRN Nanine Means, NP      . busPIRone (BUSPAR) tablet 10 mg  10 mg Oral TID Nanine Means, NP   10 mg at 05/01/13 1701  . cloNIDine (CATAPRES) tablet 0.1 mg  0.1 mg Oral QAC breakfast Nanine Means, NP   0.1 mg at 05/01/13 0752  . gabapentin (NEURONTIN) capsule 200 mg  200 mg Oral TID Rachael Fee, MD   200 mg at 05/01/13 1701  .  hydrOXYzine (ATARAX/VISTARIL) tablet 25 mg  25 mg Oral Q6H PRN Rachael Fee, MD   25 mg at 05/01/13 1429  . magnesium hydroxide (MILK OF MAGNESIA) suspension 30 mL  30 mL Oral Daily PRN Nanine Means, NP      . multivitamin with minerals tablet 1 tablet  1 tablet Oral Daily Nanine Means, NP   1 tablet at 05/01/13 0751  . nicotine (NICODERM CQ - dosed in mg/24 hours) patch 21 mg  21 mg Transdermal Daily Rachael Fee, MD   21 mg at 05/01/13 [phone]  . [START ON 05/02/2013] sertraline (ZOLOFT) tablet 100 mg  100 mg Oral Daily Rachael Fee, MD      . thiamine (B-1) injection 100 mg  100  mg Intramuscular Once Nanine Means, NP      . thiamine (VITAMIN B-1) tablet 100 mg  100 mg Oral Daily Nanine Means, NP   100 mg at 05/01/13 0751  . traZODone (DESYREL) tablet 200 mg  200 mg Oral QHS PRN Rachael Fee, MD   200 mg at 04/30/13 2228    Lab Results: No results found for this or any previous visit (from the past 48 hour(s)).  Physical Findings: AIMS: Facial and Oral Movements Muscles of Facial Expression: None, normal Lips and Perioral Area: None, normal Jaw: None, normal Tongue: None, normal,Extremity Movements Upper (arms, wrists, hands, fingers): None, normal Lower (legs, knees, ankles, toes): None, normal, Trunk Movements Neck, shoulders, hips: None, normal, Overall Severity Severity of abnormal movements (highest score from questions above): None, normal Incapacitation due to abnormal movements: None, normal Patient's awareness of abnormal movements (rate only patient's report): No Awareness, Dental Status Current problems with teeth and/or dentures?: No Does patient usually wear dentures?: No  CIWA:  CIWA-Ar Total: 1 COWS:  COWS Total Score: 3  Treatment Plan Summary: Daily contact with patient to assess and evaluate symptoms and progress in treatment Medication management  Plan: Supportive approach/coping skills/relapse prevention           Increase the Zoloft to 100 mg           CBT/grief/loss Medical Decision Making Problem Points:  Review of psycho-social stressors (1) Data Points:  Review of medication regiment & side effects (2) Review of new medications or change in dosage (2)  I certify that inpatient services furnished can reasonably be expected to improve the patient's condition.   Mitzi Lilja A 05/01/2013, 5:14 PM

## 2013-05-01 NOTE — Progress Notes (Signed)
Patient ID: Ronald Robertson, male   DOB: 1960-11-07, 52 y.o.   MRN: [id number] D: Patient in hallway on approach. Pt mood/affect is anxious and sad. Pt rated anxiety as 7 on a 0-10 scale. Pt continue to grieve over the death of wife. Pt denies SI/HI/AVH. Pt attended evening NA group and engaged in discussion. Pt denies any withdrawal from alcohol. Pt denies any needs or concerns.  Cooperative with assessment. No acute distressed noted at this time.   A: Met with pt 1:1. Writer encourage pt to use coping skills. Medications administered as prescribed. Writer encouraged pt to discuss feelings. Pt encouraged to come to staff with any question or concerns.   R: Patient remains safe. He is complaint with medications and denies any adverse reaction. Continue current POC.

## 2013-05-01 NOTE — Progress Notes (Signed)
[facility] Group Notes:  (Nursing/MHT/Case Management/Adjunct)  Date:  05/01/2013  Time:  10:22 AM  Type of Therapy:  AA Meeting   Summary of Progress/Problems: Pt attended the AA speaker group.  Aubriella Perezgarcia C 05/01/2013, 10:22 AM 
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
## 2013-05-01 NOTE — Progress Notes (Signed)
D: Patient denies SI. Patient has an intermittently anxious affect/mood.  He still has some preoccupation with medications and administration times, especially "PRN's"  A: Patient given emotional support from RN. Patient encouraged to come to staff with concerns and/or questions. Patient's medication routine continued. Patient's orders and plan of care reviewed.   R: Patient remains appropriate and cooperative. Will continue to monitor patient q15 minutes for safety.  Pt. Is polite with staff and others on the unit.  Pt. Actively participates in group activities

## 2013-05-01 NOTE — Progress Notes (Signed)
Patient did attend the evening karaoke group. Pt was engaged, supportive, and participated by singing two songs.   

## 2013-05-01 NOTE — Progress Notes (Signed)
Recreation Therapy Notes   Date: 10.16.2014 Time: 3:00pm Location: [address] Dayroom   Group Topic: Communication, Team Building, Problem Solving  Goal Area(s) Addresses:  Patient will effectively work with peer towards shared goal.  Patient will identify skill used to make activity successful.  Patient will identify how skills used during activity can be used to reach post d/c goals.   Behavioral Response: Engaged, Attentive, Appropriate   Intervention: Problem Solving Activity  Activity: Landing Pad. In teams patients were given 12 plastic drinking straws and a length of masking tape. Using the materials provided patients were asked to build a landing pad to catch a golf ball dropped from approximately 6 feet in the air.   Education: Discharge Planning, Positive Lifestyle Change   Education Outcome: Acknowledges understanding  Clinical Observations/Feedback: Patient actively engaged in activity working well with team mates, offering suggestions for building teams landing pad, as well as assisting with Holiday representative. Patient contributed to group discussion, identifying problem solving as skill needed to make activity successful. Patient related good problem solving to making good decisions post d/c.    Marykay Lex Rachard Isidro, LRT/CTRS  Nathaly Dawkins L 05/01/2013 5:13 PM

## 2013-05-02 DIAGNOSIS — F411 Generalized anxiety disorder: Secondary | ICD-10-CM

## 2013-05-02 DIAGNOSIS — F4321 Adjustment disorder with depressed mood: Secondary | ICD-10-CM

## 2013-05-02 MED ORDER — ZOLPIDEM TARTRATE 10 MG PO TABS
10.0000 mg | ORAL_TABLET | Freq: Every evening | ORAL | Status: DC | PRN
  Administered 2013-05-02: 10 mg via ORAL
  Filled 2013-05-02: qty 1

## 2013-05-02 MED ORDER — GABAPENTIN 300 MG PO CAPS
300.0000 mg | ORAL_CAPSULE | Freq: Every day | ORAL | Status: DC
  Administered 2013-05-02: 300 mg via ORAL
  Filled 2013-05-02 (×2): qty 1

## 2013-05-02 MED ORDER — METHOCARBAMOL 500 MG PO TABS
500.0000 mg | ORAL_TABLET | Freq: Three times a day (TID) | ORAL | Status: DC | PRN
  Administered 2013-05-02 – 2013-05-03 (×4): 500 mg via ORAL
  Filled 2013-05-02 (×4): qty 1

## 2013-05-02 MED ORDER — BUSPIRONE HCL 15 MG PO TABS
15.0000 mg | ORAL_TABLET | Freq: Three times a day (TID) | ORAL | Status: DC
  Administered 2013-05-02 – 2013-05-03 (×3): 15 mg via ORAL
  Filled 2013-05-02 (×5): qty 1

## 2013-05-02 MED ORDER — GABAPENTIN 300 MG PO CAPS
300.0000 mg | ORAL_CAPSULE | Freq: Three times a day (TID) | ORAL | Status: DC
  Administered 2013-05-02 – 2013-05-03 (×3): 300 mg via ORAL
  Filled 2013-05-02 (×5): qty 1

## 2013-05-02 NOTE — Progress Notes (Signed)
Patient ID: Ronald Robertson, male   DOB: Dec 01, 1960, 52 y.o.   MRN: [id number] D: Patient in hallway on approach. Pt continues to feel better and mood/affect is less anxious. Pt denies SI/HI/AVH. Pt attended evening AA group and engaged in discussion. Pt denies any withdrawal from alcohol. Pt denies any needs or concerns.  Cooperative with assessment. No acute distressed noted at this time.   A: Met with pt 1:1. Writer encourage pt to use coping skills. Medications administered as prescribed. Writer encouraged pt to discuss feelings. Pt encouraged to come to staff with any question or concerns.   R: Patient remains safe. Ronald Robertson is complaint with medications and denies any adverse reaction. Continue current POC.

## 2013-05-02 NOTE — Tx Team (Signed)
Interdisciplinary Treatment Plan Update (Adult)  Date: 05/02/2013 11:01 AM  Time Reviewed: Progress in Treatment:  Attending groups: Yes Participating in groups:  Yes Taking medication as prescribed: Yes  Tolerating medication: Yes  Family/Significant othe contact made: Yes. Contact made with pt's friend and primary support, Dorma Russell to discuss follow up/provide resources. SPE not required for this pt.  Patient understands diagnosis: Yes, AEB seeking treatment for ETOH detox, opiate detox, and depression.  Discussing patient identified problems/goals with staff: Yes  Medical problems stabilized or resolved: Yes  Denies suicidal/homicidal ideation: Yes during admission, group, and self report.  Patient has not harmed self or Others: Yes  New problem(s) identified: n/a  Discharge Plan or Barriers:  Pt has appt scheduled with PCP and CSW has been in contact with pt's friend, Dorma Russell about d/c plans. Pt and Dorma Russell will decide upon further treatment after d/c and CSW provided both with resources, including Hospice grief counseling contact info.  Additional comments: Further med changes through the weekend in order to assist pt with reducing anxity.   Reason for Continuation of Hospitalization:  Medication management Mood stabilization Estimated length of stay: 2-3 days (likely d/c Sun or Monday).    For review of initial/current patient goals, please see plan of care.  Attendees:  Patient:    Family:    Physician: Geoffery Lyons MD 05/02/2013 11:01 AM   Nursing: Sue Lush RN  05/02/2013 11:01 AM   Clinical Social Worker [employer], LCSWA  05/02/2013 11:01 AM   Other: Massie Kluver, Care Coordination    Other:    Other: Darden Dates Nurse CM  05/02/2013 11:01 AM   Other:    Scribe for Treatment Team:  [employer] LCSWA 05/02/2013 11:01 AM

## 2013-05-02 NOTE — Progress Notes (Signed)
[facility] MD Progress Note  05/02/2013 7:49 PM Ronald Robertson  MRN:  [id number] Subjective:  Ronald Robertson continues to experience anxiety. He does not feel he is withdrawing but that the is having anxiety and depression. Without the drugs he is experiencing feelings and does not know how to handle them. Continue to deal with the death of his wife. He woke up from a dream has states his heart was beating fast. He woke up around 2:30 AM and could not go back to sleep Diagnosis:   DSM5: Schizophrenia Disorders:   Obsessive-Compulsive Disorders:   Trauma-Stressor Disorders:   Substance/Addictive Disorders:  Alcohol Related Disorder - Severe (303.90) and Opioid Disorder - Severe (304.00) Depressive Disorders:  Major Depressive Disorder - Moderate (296.22)  Axis I: Anxiety Disorder NOS and Bereavement  ADL's:  Intact  Sleep: Poor  Appetite:  Poor  Suicidal Ideation:  Plan:  denies Intent:  denies Means:  denies Homicidal Ideation:  Plan:  denies Intent:  denies Means:  denies AEB (as evidenced by):  Psychiatric Specialty Exam: Review of Systems  Constitutional: Negative.   HENT: Negative.   Eyes: Negative.   Respiratory: Negative.   Cardiovascular: Negative.   Gastrointestinal: Negative.   Genitourinary: Negative.   Musculoskeletal: Negative.   Skin: Negative.   Neurological: Negative.   Endo/Heme/Allergies: Negative.   Psychiatric/Behavioral: Positive for depression and substance abuse. The patient is nervous/anxious.     Blood pressure 132/90, pulse 120, temperature 98.7 F (37.1 C), temperature source Oral, resp. rate 20, height 5\' 11"  (1.803 m), weight 107.502 kg (237 lb), SpO2 95.00%.Body mass index is 33.07 kg/(m^2).  General Appearance: Fairly Groomed  Patent attorney::  Fair  Speech:  Clear and Coherent  Volume:  Decreased  Mood:  Anxious and Depressed  Affect:  Tearful  Thought Process:  Coherent and Goal Directed  Orientation:  Full (Time, Place, and Person)  Thought  Content:  worries, concerns, loss of his wife  Suicidal Thoughts:  No  Homicidal Thoughts:  No  Memory:  Immediate;   Fair Recent;   Fair Remote;   Fair  Judgement:  Fair  Insight:  Present  Psychomotor Activity:  Restlessness  Concentration:  Fair  Recall:  Fair  Akathisia:  No  Handed:     AIMS (if indicated):     Assets:  Desire for Improvement  Sleep:  Number of Hours: 5   Current Medications: Current Facility-Administered Medications  Medication Dose Route Frequency Provider Last Rate Last Dose  . acetaminophen (TYLENOL) tablet 650 mg  650 mg Oral Q6H PRN Nanine Means, NP      . alum & mag hydroxide-simeth (MAALOX/MYLANTA) 200-200-20 MG/5ML suspension 30 mL  30 mL Oral Q4H PRN Nanine Means, NP      . busPIRone (BUSPAR) tablet 15 mg  15 mg Oral TID Rachael Fee, MD   15 mg at 05/02/13 1714  . gabapentin (NEURONTIN) capsule 300 mg  300 mg Oral TID Rachael Fee, MD   300 mg at 05/02/13 1714  . gabapentin (NEURONTIN) capsule 300 mg  300 mg Oral QHS Rachael Fee, MD      . hydrOXYzine (ATARAX/VISTARIL) tablet 25 mg  25 mg Oral Q6H PRN Rachael Fee, MD   25 mg at 05/02/13 1714  . magnesium hydroxide (MILK OF MAGNESIA) suspension 30 mL  30 mL Oral Daily PRN Nanine Means, NP      . methocarbamol (ROBAXIN) tablet 500 mg  500 mg Oral Q8H PRN Rachael Fee, MD  500 mg at 05/02/13 1525  . multivitamin with minerals tablet 1 tablet  1 tablet Oral Daily Nanine Means, NP   1 tablet at 05/02/13 [phone]  . nicotine (NICODERM CQ - dosed in mg/24 hours) patch 21 mg  21 mg Transdermal Daily Rachael Fee, MD   21 mg at 05/02/13 0800  . sertraline (ZOLOFT) tablet 100 mg  100 mg Oral Daily Rachael Fee, MD   100 mg at 05/02/13 9604  . thiamine (B-1) injection 100 mg  100 mg Intramuscular Once Nanine Means, NP      . thiamine (VITAMIN B-1) tablet 100 mg  100 mg Oral Daily Nanine Means, NP   100 mg at 05/02/13 5409  . traZODone (DESYREL) tablet 200 mg  200 mg Oral QHS PRN Rachael Fee, MD   200 mg  at 05/01/13 2207  . zolpidem (AMBIEN) tablet 10 mg  10 mg Oral QHS PRN Rachael Fee, MD        Lab Results: No results found for this or any previous visit (from the past 48 hour(s)).  Physical Findings: AIMS: Facial and Oral Movements Muscles of Facial Expression: None, normal Lips and Perioral Area: None, normal Jaw: None, normal Tongue: None, normal,Extremity Movements Upper (arms, wrists, hands, fingers): None, normal Lower (legs, knees, ankles, toes): None, normal, Trunk Movements Neck, shoulders, hips: None, normal, Overall Severity Severity of abnormal movements (highest score from questions above): None, normal Incapacitation due to abnormal movements: None, normal Patient's awareness of abnormal movements (rate only patient's report): No Awareness, Dental Status Current problems with teeth and/or dentures?: No Does patient usually wear dentures?: No  CIWA:  CIWA-Ar Total: 3 COWS:  COWS Total Score: 3  Treatment Plan Summary: Daily contact with patient to assess and evaluate symptoms and progress in treatment Medication management  Plan: Supportitve approach/coping skills/relapse prevention           Continue to optimize treatment with psychotropics           Add Neurontin 300 mg HS           Try Ambien 10 mg HS PRN sleep Medical Decision Making Problem Points:  Review of psycho-social stressors (1) Data Points:  Review of medication regiment & side effects (2) Review of new medications or change in dosage (2)  I certify that inpatient services furnished can reasonably be expected to improve the patient's condition.   Melika Reder A 05/02/2013, 7:49 PM

## 2013-05-02 NOTE — [facility] Group Notes (Signed)
[facility] LCSW Aftercare Discharge Planning Group Note   05/02/2013 10:03 AM  Participation Quality:  Appropriate   Mood/Affect:  Appropriate  Depression Rating:  1  Anxiety Rating:  8  Thoughts of Suicide:  No Will you contract for safety?   NA  Current AVH:  No  Plan for Discharge/Comments:  Pt stated that he didn't sleep well last night and still experiencing high pulse and anxiety. CSW reminded pt of PCP appt on Tuesday. Pt plans to follow up with Hospice for bereavement counseling. MD making some more med changes today to decrease pt's anxiety.   Transportation Means: friend   Supports: friends/limited family support.   Smart, [employer]

## 2013-05-02 NOTE — [facility] Group Notes (Signed)
[facility] LCSW Group Therapy  05/02/2013 3:50 PM  Type of Therapy:  Group Therapy  Participation Level:  Active  Participation Quality:  Attentive  Affect:  Tearful  Cognitive:  Alert and Oriented  Insight:  Engaged  Engagement in Therapy:  Engaged  Modes of Intervention:  Confrontation, Discussion, Education, Exploration, Socialization and Support  Summary of Progress/Problems: Feelings around Relapse. Group members discussed the meaning of relapse and shared personal stories of relapse, how it affected them and others, and how they perceived themselves during this time. Group members were encouraged to identify triggers, warning signs and coping skills used when facing the possibility of relapse. Social supports were discussed and explored in detail. Ronald Robertson was attentive and engaged throughout today's therapy group. Ronald Robertson acknowledged that he had a drug/alcohol problem long before the death of his wife, but that his substance abuse "increased substantially after her death in order to numb the pain." Ronald Robertson stated that he is ready to face the pain and find closure by going to grief counseling and working through his pain in a healthier way. Ronald Robertson shows progress in the group setting and improving insight AEB his ability to actively participate in the group setting, encourage others to participate/share their thoughts and feelings, and identify what he must do in order to avoid future relapses "therpay, support groups, and staying close to supportive friends and family."    Smart, Herbert Seta 05/02/2013, 3:50 PM

## 2013-05-02 NOTE — Progress Notes (Signed)
D Hershall remains very anxious and quite fixated on his medications, ie what time his next prn is due, etc...   A He attends his groups, is actively engaged in his poc and is trying to learn to be healthier, but struggles with his free-floating anxiety and his inabilitity to regulate his emotions and his behavior.   R Safety is in place and poc moves forward.

## 2013-05-02 NOTE — Progress Notes (Signed)
Adult Psychoeducational Group Note  Date:  05/02/2013 Time:  1:57 PM  Group Topic/Focus:  Recovery Goals:   The focus of this group is to identify appropriate goals for recovery and establish a plan to achieve them.  Participation Level:  Active  Participation Quality:  Attentive and Drowsy  Affect:  Appropriate  Cognitive:  Alert and Appropriate  Insight: Good  Engagement in Group:  Engaged and Improving  Modes of Intervention:  Discussion, Education, Socialization and Support  Additional Comments:  Group focused on the Journey to Recovery and the key ingredients towards this path. Even though the pt was drowsy he participated and supported other patients.  Reynolds Bowl 05/02/2013, 1:57 PM

## 2013-05-02 NOTE — [facility] Group Notes (Signed)
[facility] Group Notes:  (Nursing/MHT/Case Management/Adjunct)  Date:  05/02/2013  Time:  2:48 PM  Type of Therapy:  Psychoeducational Skills  Participation Level:  Active  Participation Quality:  Appropriate  Affect:  Appropriate  Cognitive:  Appropriate  Insight:  Appropriate  Engagement in Group:  Engaged  Modes of Intervention:  Discussion, Rapport Building, Socialization and Support  Summary of Progress/Problems: Pt attended group and participated by sharing his strength. Pt's strength is "helping others."    Tania Ade 05/02/2013, 2:48 PM

## 2013-05-02 NOTE — Progress Notes (Signed)
Patient ID: Ronald Robertson, male   DOB: 1960/09/26, 52 y.o.   MRN: [id number] D: Patient in hallway on approach. Pt mood/affect is anxious. Pt rated anxiety as 7 on a 0-10 scale. Pt is fixated on medication regimen and writes every medication down and when he can have it next. Pt denies SI/HI/AVH. Pt attended evening karaoke group and participated in songs. Pt denies any withdrawal from alcohol. Pt denies any needs or concerns.  Cooperative with assessment. No acute distressed noted at this time.   A: Met with pt 1:1. Writer encourage pt to use coping skills. Medications administered as prescribed. Writer encouraged pt to discuss feelings. Pt encouraged to come to staff with any question or concerns.   R: Patient remains safe. He is complaint with medications and denies any adverse reaction. Continue current POC.

## 2013-05-03 ENCOUNTER — Encounter (HOSPITAL_COMMUNITY): Payer: Self-pay | Admitting: *Deleted

## 2013-05-03 ENCOUNTER — Other Ambulatory Visit: Payer: Self-pay

## 2013-05-03 ENCOUNTER — Inpatient Hospital Stay (HOSPITAL_COMMUNITY)
Admission: AD | Admit: 2013-05-03 | Discharge: 2013-05-04 | DRG: 309 | Disposition: A | Discharge status: Other Acute Inpatient Hospital | Payer: Self-pay | Source: Ambulatory Visit | Attending: Internal Medicine | Admitting: Internal Medicine

## 2013-05-03 DIAGNOSIS — F1420 Cocaine dependence, uncomplicated: Secondary | ICD-10-CM | POA: Diagnosis present

## 2013-05-03 DIAGNOSIS — R000 Tachycardia, unspecified: Secondary | ICD-10-CM

## 2013-05-03 DIAGNOSIS — F332 Major depressive disorder, recurrent severe without psychotic features: Secondary | ICD-10-CM | POA: Diagnosis present

## 2013-05-03 DIAGNOSIS — Z634 Disappearance and death of family member: Secondary | ICD-10-CM

## 2013-05-03 DIAGNOSIS — I10 Essential (primary) hypertension: Secondary | ICD-10-CM | POA: Diagnosis present

## 2013-05-03 DIAGNOSIS — F1010 Alcohol abuse, uncomplicated: Secondary | ICD-10-CM

## 2013-05-03 DIAGNOSIS — I498 Other specified cardiac arrhythmias: Principal | ICD-10-CM | POA: Diagnosis present

## 2013-05-03 DIAGNOSIS — F329 Major depressive disorder, single episode, unspecified: Secondary | ICD-10-CM

## 2013-05-03 DIAGNOSIS — F1210 Cannabis abuse, uncomplicated: Secondary | ICD-10-CM | POA: Diagnosis present

## 2013-05-03 DIAGNOSIS — F17200 Nicotine dependence, unspecified, uncomplicated: Secondary | ICD-10-CM | POA: Diagnosis present

## 2013-05-03 DIAGNOSIS — F1114 Opioid abuse with opioid-induced mood disorder: Secondary | ICD-10-CM

## 2013-05-03 DIAGNOSIS — F1994 Other psychoactive substance use, unspecified with psychoactive substance-induced mood disorder: Secondary | ICD-10-CM | POA: Diagnosis present

## 2013-05-03 DIAGNOSIS — F1020 Alcohol dependence, uncomplicated: Secondary | ICD-10-CM | POA: Diagnosis present

## 2013-05-03 LAB — CBC
Hemoglobin: 15.8 g/dL (ref 13.0–17.0)
MCH: 33 pg (ref 26.0–34.0)
MCV: 93.7 fL (ref 78.0–100.0)
Platelets: 201 10*3/uL (ref 150–400)
RBC: 4.79 MIL/uL (ref 4.22–5.81)

## 2013-05-03 LAB — BASIC METABOLIC PANEL
Calcium: 9 mg/dL (ref 8.4–10.5)
GFR calc Af Amer: >90 mL/min (ref >90)
GFR calc non Af Amer: >90 mL/min (ref >90)
Glucose, Bld: 88 mg/dL (ref 70–99)
Potassium: 3.8 mEq/L (ref 3.5–5.1)
Sodium: 138 mEq/L (ref 135–145)

## 2013-05-03 LAB — MRSA PCR SCREENING: MRSA by PCR: NEGATIVE

## 2013-05-03 LAB — TSH: TSH: 1.994 u[IU]/mL (ref 0.350–4.500)

## 2013-05-03 LAB — TROPONIN I: Troponin I: <0.3 ng/mL (ref <0.30)

## 2013-05-03 LAB — D-DIMER, QUANTITATIVE: D-Dimer, Quant: 0.97 ug/mL-FEU — ABNORMAL HIGH (ref 0.00–0.48)

## 2013-05-03 MED ORDER — VITAMIN B-1 100 MG PO TABS
100.0000 mg | ORAL_TABLET | Freq: Every day | ORAL | Status: DC
  Administered 2013-05-03 – 2013-05-04 (×2): 100 mg via ORAL
  Filled 2013-05-03 (×2): qty 1

## 2013-05-03 MED ORDER — HYDROXYZINE PAMOATE 50 MG PO CAPS
50.0000 mg | ORAL_CAPSULE | Freq: Four times a day (QID) | ORAL | Status: DC
  Filled 2013-05-03 (×3): qty 1

## 2013-05-03 MED ORDER — ONDANSETRON HCL 4 MG PO TABS: 4.0000 mg | ORAL_TABLET | Freq: Four times a day (QID) | ORAL | Status: DC | PRN

## 2013-05-03 MED ORDER — SERTRALINE HCL 100 MG PO TABS: 100.0000 mg | ORAL_TABLET | Freq: Every day | ORAL | Status: DC

## 2013-05-03 MED ORDER — BUSPIRONE HCL 15 MG PO TABS
15.0000 mg | ORAL_TABLET | Freq: Three times a day (TID) | ORAL | Status: DC
  Administered 2013-05-03 – 2013-05-04 (×2): 15 mg via ORAL
  Filled 2013-05-03 (×4): qty 1

## 2013-05-03 MED ORDER — INFLUENZA VAC SPLIT QUAD 0.5 ML IM SUSP
0.5000 mL | INTRAMUSCULAR | Status: AC
  Administered 2013-05-04: 0.5 mL via INTRAMUSCULAR
  Filled 2013-05-03 (×2): qty 0.5

## 2013-05-03 MED ORDER — BUSPIRONE HCL 15 MG PO TABS: 15.0000 mg | ORAL_TABLET | Freq: Three times a day (TID) | ORAL | Status: DC

## 2013-05-03 MED ORDER — METOPROLOL TARTRATE 12.5 MG HALF TABLET
12.5000 mg | ORAL_TABLET | Freq: Two times a day (BID) | ORAL | Status: DC
  Filled 2013-05-03 (×2): qty 1

## 2013-05-03 MED ORDER — ZOLPIDEM TARTRATE 10 MG PO TABS
10.0000 mg | ORAL_TABLET | Freq: Every evening | ORAL | Status: DC | PRN
  Administered 2013-05-03: 10 mg via ORAL
  Filled 2013-05-03: qty 1

## 2013-05-03 MED ORDER — ONDANSETRON HCL 4 MG/2ML IJ SOLN: 4.0000 mg | Freq: Four times a day (QID) | INTRAMUSCULAR | Status: DC | PRN

## 2013-05-03 MED ORDER — GABAPENTIN 300 MG PO CAPS: ORAL_CAPSULE | ORAL | Status: DC

## 2013-05-03 MED ORDER — ADULT MULTIVITAMIN W/MINERALS CH
1.0000 | ORAL_TABLET | Freq: Every day | ORAL | Status: DC
  Administered 2013-05-03 – 2013-05-04 (×2): 1 via ORAL
  Filled 2013-05-03 (×2): qty 1

## 2013-05-03 MED ORDER — SERTRALINE HCL 100 MG PO TABS
100.0000 mg | ORAL_TABLET | Freq: Every day | ORAL | Status: DC
  Administered 2013-05-04: 100 mg via ORAL
  Filled 2013-05-03: qty 1

## 2013-05-03 MED ORDER — METOPROLOL TARTRATE 12.5 MG HALF TABLET
12.5000 mg | ORAL_TABLET | Freq: Two times a day (BID) | ORAL | Status: DC
  Administered 2013-05-03: 12.5 mg via ORAL
  Filled 2013-05-03 (×3): qty 1

## 2013-05-03 MED ORDER — ACETAMINOPHEN 325 MG PO TABS: 650.0000 mg | ORAL_TABLET | Freq: Four times a day (QID) | ORAL | Status: DC | PRN

## 2013-05-03 MED ORDER — METHOCARBAMOL 500 MG PO TABS
500.0000 mg | ORAL_TABLET | Freq: Three times a day (TID) | ORAL | Status: DC
  Administered 2013-05-03 – 2013-05-04 (×3): 500 mg via ORAL
  Filled 2013-05-03 (×6): qty 1

## 2013-05-03 MED ORDER — TRAZODONE HCL 100 MG PO TABS: 200.0000 mg | ORAL_TABLET | Freq: Every evening | ORAL | Status: DC | PRN

## 2013-05-03 MED ORDER — ACETAMINOPHEN 650 MG RE SUPP: 650.0000 mg | Freq: Four times a day (QID) | RECTAL | Status: DC | PRN

## 2013-05-03 MED ORDER — FOLIC ACID 1 MG PO TABS
1.0000 mg | ORAL_TABLET | Freq: Every day | ORAL | Status: DC
  Administered 2013-05-03 – 2013-05-04 (×2): 1 mg via ORAL
  Filled 2013-05-03 (×2): qty 1

## 2013-05-03 MED ORDER — ENOXAPARIN SODIUM 60 MG/0.6ML ~~LOC~~ SOLN
55.0000 mg | SUBCUTANEOUS | Status: DC
  Administered 2013-05-03: 19:00:00 55 mg via SUBCUTANEOUS
  Filled 2013-05-03 (×2): qty 0.6

## 2013-05-03 MED ORDER — GABAPENTIN 300 MG PO CAPS
300.0000 mg | ORAL_CAPSULE | Freq: Three times a day (TID) | ORAL | Status: DC
  Administered 2013-05-03 – 2013-05-04 (×2): 300 mg via ORAL
  Filled 2013-05-03 (×4): qty 1

## 2013-05-03 MED ORDER — CHLORDIAZEPOXIDE HCL 25 MG PO CAPS: 25.0000 mg | ORAL_CAPSULE | Freq: Three times a day (TID) | ORAL | Status: DC | PRN

## 2013-05-03 MED ORDER — HYDROXYZINE HCL 50 MG PO TABS
50.0000 mg | ORAL_TABLET | Freq: Four times a day (QID) | ORAL | Status: DC
  Administered 2013-05-03 – 2013-05-04 (×4): 50 mg via ORAL
  Filled 2013-05-03 (×6): qty 1

## 2013-05-03 MED ORDER — HYDROXYZINE HCL 25 MG PO TABS
25.0000 mg | ORAL_TABLET | Freq: Four times a day (QID) | ORAL | Status: DC | PRN
  Administered 2013-05-03: 25 mg via ORAL

## 2013-05-03 MED ORDER — HYDROXYZINE HCL 25 MG PO TABS
ORAL_TABLET | ORAL | Status: AC
  Filled 2013-05-03: qty 1

## 2013-05-03 MED ORDER — SODIUM CHLORIDE 0.9 % IJ SOLN: 3.0000 mL | Freq: Two times a day (BID) | INTRAMUSCULAR | Status: DC

## 2013-05-03 MED ORDER — METOPROLOL TARTRATE 12.5 MG HALF TABLET: 12.5000 mg | ORAL_TABLET | Freq: Two times a day (BID) | ORAL | Status: DC

## 2013-05-03 MED ORDER — ASPIRIN EC 81 MG PO TBEC
81.0000 mg | DELAYED_RELEASE_TABLET | Freq: Every day | ORAL | Status: DC
  Administered 2013-05-03 – 2013-05-04 (×2): 81 mg via ORAL
  Filled 2013-05-03 (×2): qty 1

## 2013-05-03 MED ORDER — SODIUM CHLORIDE 0.9 % IV SOLN
INTRAVENOUS | Status: DC
  Administered 2013-05-03: 1000 mL via INTRAVENOUS
  Administered 2013-05-04: 06:00:00 via INTRAVENOUS
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
## 2013-05-03 NOTE — Progress Notes (Signed)
[facility] MD Progress Note  05/03/2013 6:24 PM Ronald Robertson  MRN:  [id number] Subjective:  Did sleep better last night with the Ambien. Had an episode of HR of 156-157 An EKG showed the previous findings sinus tachycardia with possible old MI. He met with the chaplain and found it helpful. He states he knows it is going to be hard to move on bu the has to do it.  Diagnosis:   DSM5: Schizophrenia Disorders:   Obsessive-Compulsive Disorders:   Trauma-Stressor Disorders:   Substance/Addictive Disorders:  Alcohol Related Disorder - Severe (303.90) and Opioid Disorder - Severe (304.00) Depressive Disorders:  Major Depressive Disorder - Severe (296.23)  Axis I: Bereavement  ADL's:  Intact  Sleep: Fair  Appetite:  Fair  Suicidal Ideation:  Plan:  denies Intent:  denies Means:  denies Homicidal Ideation:  Plan:  denies Intent:  denies Means:  denies AEB (as evidenced by):  Psychiatric Specialty Exam: Review of Systems  Constitutional: Negative.   HENT: Negative.   Eyes: Negative.   Respiratory: Negative.   Cardiovascular: Negative.   Gastrointestinal: Negative.   Genitourinary: Negative.   Musculoskeletal: Negative.   Skin: Negative.   Neurological: Negative.   Endo/Heme/Allergies: Negative.   Psychiatric/Behavioral: Positive for depression and substance abuse. The patient is nervous/anxious and has insomnia.     Blood pressure 126/74, pulse 125, temperature 97.5 F (36.4 C), temperature source Oral, resp. rate 17, height 5\' 11"  (1.803 m), weight 107.502 kg (237 lb), SpO2 95.00%.Body mass index is 33.07 kg/(m^2).  General Appearance: Fairly Groomed  Patent attorney::  Fair  Speech:  Clear and Coherent and Slow  Volume:  Decreased  Mood:  Anxious and Depressed  Affect:  Restricted  Thought Process:  Coherent and Goal Directed  Orientation:  Full (Time, Place, and Person)  Thought Content:  worries, concerns  Suicidal Thoughts:  No  Homicidal Thoughts:  No  Memory:   Immediate;   Fair Recent;   Fair Remote;   Fair  Judgement:  Fair  Insight:  Present  Psychomotor Activity:  Normal  Concentration:  Fair  Recall:  Fair  Akathisia:  No  Handed:    AIMS (if indicated):     Assets:  Desire for Improvement Social Support  Sleep:  Number of Hours: 5   Current Medications: No current facility-administered medications for this encounter.   No current outpatient prescriptions on file.   Facility-Administered Medications Ordered in Other Encounters  Medication Dose Route Frequency Provider Last Rate Last Dose  . 0.9 %  sodium chloride infusion   Intravenous Continuous Adeline C Viyuoh, MD      . acetaminophen (TYLENOL) tablet 650 mg  650 mg Oral Q6H PRN Kela Millin, MD       Or  . acetaminophen (TYLENOL) suppository 650 mg  650 mg Rectal Q6H PRN Adeline C Viyuoh, MD      . aspirin EC tablet 81 mg  81 mg Oral Daily Adeline C Viyuoh, MD      . busPIRone (BUSPAR) tablet 15 mg  15 mg Oral TID Adeline C Viyuoh, MD      . enoxaparin (LOVENOX) injection 55 mg  55 mg Subcutaneous Q24H Adeline C Viyuoh, MD      . folic acid (FOLVITE) tablet 1 mg  1 mg Oral Daily Adeline C Viyuoh, MD      . gabapentin (NEURONTIN) capsule 300 mg  300 mg Oral TID Kela Millin, MD      . hydrOXYzine (ATARAX/VISTARIL) tablet 50  mg  50 mg Oral QID Jerald Kief, MD      . Melene Muller ON 05/04/2013] influenza vac split quadrivalent PF (FLUARIX) injection 0.5 mL  0.5 mL Intramuscular Tomorrow-1000 Jerald Kief, MD      . methocarbamol (ROBAXIN) tablet 500 mg  500 mg Oral Q8H Adeline C Viyuoh, MD      . metoprolol tartrate (LOPRESSOR) tablet 12.5 mg  12.5 mg Oral BID Adeline C Viyuoh, MD      . multivitamin with minerals tablet 1 tablet  1 tablet Oral Daily Adeline C Viyuoh, MD      . ondansetron (ZOFRAN) tablet 4 mg  4 mg Oral Q6H PRN Adeline C Viyuoh, MD       Or  . ondansetron (ZOFRAN) injection 4 mg  4 mg Intravenous Q6H PRN Kela Millin, MD      . Melene Muller ON 05/04/2013]  sertraline (ZOLOFT) tablet 100 mg  100 mg Oral Daily Adeline C Viyuoh, MD      . sodium chloride 0.9 % injection 3 mL  3 mL Intravenous Q12H Adeline C Viyuoh, MD      . thiamine (VITAMIN B-1) tablet 100 mg  100 mg Oral Daily Adeline C Viyuoh, MD      . zolpidem (AMBIEN) tablet 10 mg  10 mg Oral QHS PRN Kela Millin, MD        Lab Results: No results found for this or any previous visit (from the past 48 hour(s)).  Physical Findings: AIMS: Facial and Oral Movements Muscles of Facial Expression: None, normal Lips and Perioral Area: None, normal Jaw: None, normal Tongue: None, normal,Extremity Movements Upper (arms, wrists, hands, fingers): None, normal Lower (legs, knees, ankles, toes): None, normal, Trunk Movements Neck, shoulders, hips: None, normal, Overall Severity Severity of abnormal movements (highest score from questions above): None, normal Incapacitation due to abnormal movements: None, normal Patient's awareness of abnormal movements (rate only patient's report): No Awareness, Dental Status Current problems with teeth and/or dentures?: No Does patient usually wear dentures?: No  CIWA:  CIWA-Ar Total: 5 COWS:  COWS Total Score: 3  Treatment Plan Summary: Daily contact with patient to assess and evaluate symptoms and progress in treatment Medication management  Plan: Supportive approach/coping skills/relapse prevention           Will send to [facility] for further assessment of his heart condition  Medical Decision Making Problem Points:  Review of psycho-social stressors (1) Data Points:  Review of new medications or change in dosage (2)  I certify that inpatient services furnished can reasonably be expected to improve the patient's condition.   Ronald Robertson A 05/03/2013, 6:24 PM

## 2013-05-03 NOTE — Progress Notes (Signed)
[facility] History and Physical  Ronald Robertson ZOX:[id number] DOB: [birth year] DOA: 05/03/2013  Referring physician: Dr Dub Mikes PCP: Default, Provider, MD  Specialists: NONE  Chief Complaint: Tachycardia  HPI: Ronald Robertson is a 52 y.o. male past medical history significant for hypertension, alcohol abuse, depression who was admitted to [facility] on 10/12 for alcohol and opiod detox and today he noted per nursing to be tachycardic to the 150s and transferred to [facility] long further evaluation and management. The patient states that since his admission 10/12 at [facility] every morning his heart rate goes up to the 150s and improves as the day goes by and this was attributed to his alcohol detox. Today he reports he got tachycardic again this a.m., and the nursing staff noted that he has been detoxed for about a week now(per admission H&P had been on the prominent clonidine our protocols), but was still tachycardic and so hospitalist service are was called for admission. It is noted that he was started on metoprolol 12.5 twice a day today at [facility] for hypertension and also to help with her tachycardia. EKG done prior to transfer to [facility] long showed sinus tachycardia.His heart rate so far in the step down unit has stayed in the 90s The patient denies chest pain, shortness of breath, cough,  nausea, vomiting, fevers, dysuria, leg swelling and no leg pain. As already mentioned he's had no alcohol or opiates in the past week a [facility] and had been placed on Librium and clonidine protocols day or as appropriate. Patient states he's had increased anxiety and attributes his tachycardia to this.   Review of Systems: The patient denies anorexia, fever, weight loss, vision loss, decreased hearing, hoarseness, chest pain, syncope,  peripheral edema, balance deficits, hemoptysis, abdominal pain, melena, hematochezia, severe indigestion/heartburn, hematuria, incontinence, muscle weakness, suspicious skin lesions, transient  blindness, difficulty walking, depression, unusual weight change.    Past Medical History  Diagnosis Date  . Hypertension    Past Surgical History  Procedure Laterality Date  . Abdominal surgery      ulcer repair  . Gi ulcer rupture    . Shoulder fusion surgery Left   . Tonsillectomy     Social History:  reports that he has been smoking.  He does not have any smokeless tobacco history on file. He reports that he drinks about 126.0 ounces of alcohol per week. He reports that he uses illicit drugs (Marijuana).  where does patient live - from [facility] Can patient participate in ADLs-YES  No Known Allergies  Family History  Problem Relation Age of Onset  . Adopted: Yes    Prior to Admission medications   Medication Sig Start Date End Date Taking? Authorizing Provider  busPIRone (BUSPAR) 15 MG tablet Take 1 tablet (15 mg total) by mouth 3 (three) times daily. 05/03/13  Yes Verne Spurr, PA-C  gabapentin (NEURONTIN) 300 MG capsule Take 1 capsule 3 x a day and 1 at bedtime for anxiety and neuropathic pain. 05/03/13  Yes Verne Spurr, PA-C  hydrOXYzine (VISTARIL) 50 MG capsule Take 50 mg by mouth every 6 (six) hours.   Yes Historical Provider, MD  methocarbamol (ROBAXIN) 500 MG tablet Take 500 mg by mouth every 8 (eight) hours.   Yes Historical Provider, MD  sertraline (ZOLOFT) 100 MG tablet Take 1 tablet (100 mg total) by mouth daily. For anxiety and depression. 05/03/13  Yes Verne Spurr, PA-C  zolpidem (AMBIEN) 10 MG tablet Take 10 mg by mouth at bedtime as needed for sleep.  Yes Historical Provider, MD  metoprolol tartrate (LOPRESSOR) 12.5 mg TABS tablet Take 0.5 tablets (12.5 mg total) by mouth 2 (two) times daily. For hypertension. 05/03/13   Verne Spurr, PA-C   Physical Exam: Filed Vitals:   05/03/13 1600  BP: 144/94  Pulse: 99  Temp: 98.6 F (37 C)  Resp: 26    Constitutional: Vital signs reviewed.  Patient is a well-developed and well-nourished  in no acute distress  and cooperative with exam. Alert and oriented x3.  Head: Normocephalic and atraumatic Mouth: no erythema or exudates, MMM Eyes: PERRL, EOMI, conjunctivae normal, No scleral icterus.  Neck: Supple, Trachea midline normal ROM, No JVD, mass, thyromegaly, or carotid bruit present.  Cardiovascular: RRR, S1 normal, S2 normal, no MRG, pulses symmetric and intact bilaterally Pulmonary/Chest: normal respiratory effort, CTAB, no wheezes, rales, or rhonchi Abdominal: Soft. Non-tender, non-distended, bowel sounds are normal, no masses, organomegaly, or guarding present.  GU: no CVA tenderness  Extremities: No cyanosis and no edema.no tremor or  Neurological: A&O x3, Strength is normal and symmetric bilaterally, cranial nerve II-XII are grossly intact, no focal motor deficit, sensory intact to light touch bilaterally.  Skin: Warm, dry and intact. No rash, cyanosis, or clubbing.  Psychiatric: Normal mood and affect. speech and behavior is normal.    Labs on Admission:  Basic Metabolic Panel:  Recent Labs Lab 04/27/13 1922  NA 138  K 4.0  CL 103  CO2 28  GLUCOSE 116*  BUN 11  CREATININE 0.81  CALCIUM 8.5   Liver Function Tests:  Recent Labs Lab 04/27/13 1922  AST 70*  ALT 69*  ALKPHOS 72  BILITOT 0.7  PROT 6.1  ALBUMIN 2.9*   No results found for this basename: LIPASE, AMYLASE,  in the last 168 hours No results found for this basename: AMMONIA,  in the last 168 hours CBC: No results found for this basename: WBC, NEUTROABS, HGB, HCT, MCV, PLT,  in the last 168 hours Cardiac Enzymes: No results found for this basename: CKTOTAL, CKMB, CKMBINDEX, TROPONINI,  in the last 168 hours  BNP (last 3 results) No results found for this basename: PROBNP,  in the last 8760 hours CBG: No results found for this basename: GLUCAP,  in the last 168 hours  Radiological Exams on Admission: No results found.    Assessment/Plan Active Problems:   Tachycardia, sinus -As discussed above in  patient with alcohol/opiate abuse who has been detoxed as per protocol at [facility]. for the past one week. He clinically has no other evidence of withdrawal at this time. -Will cycle cardiac enzymes, follow and further evaluate/ consult cardiology pending results -obtain d-dimer-follow and if elevated for recommend CT and she are to eval for PE -We'll also obtain a TSH and follow -Will continue metoprolol, Monitor closely in step down unit further treat accordingly. -Obtain chemistries, hemoglobin,  and also hydrate with IV fluids,   Major depression/anxiety -Continue meds as per psychiatrist-buspirone, hydroxyzine, Zoloft   Alcohol abuse -Status post detox as per protocol at the site for the past one week as discussed above -Place on MVI, thiamine, and he turned further treat accordingly.   Opioid abuse with opioid-induced mood disorder -S/P detox at [facility] as per protocol the past one week -Continue Robaxin, Tylenol when necessary. Continue avoiding opiates   HTN (hypertension), benign -Continue metoprolol     Code Status: full  Family Communication: none at bedside Disposition Plan:   Time spent: >30  Kela Millin [facility] Pager [phone]  If 7PM-7AM,  please contact night-coverage [email or web address] Password [email or web address] 05/03/2013, 5:11 PM

## 2013-05-03 NOTE — B[facility] Group Notes (Signed)
[facility] Group Notes:  (Nursing/MHT/Case Management/Adjunct)  Date:  05/03/2013  Time:  1:31 PM  Type of Therapy:  Psychoeducational Skills  Participation Level:  Active  Participation Quality:  Appropriate  Affect:  Appropriate  Cognitive:  Appropriate  Insight:  Appropriate  Engagement in Group:  Engaged  Modes of Intervention:  Problem-solving  Summary of Progress/Problems: Pt attended healthy coping skills group and self inventory group.  Ronald Robertson 05/03/2013, 1:31 PM

## 2013-05-03 NOTE — Progress Notes (Signed)
Chaplain paged for Ronald Ronald Robertson for spiritual care to deal with grief and addiction problems.  Ronald Robertson's wife Ronald Robertson died in 03/29/2023 as the result of diseases developed in her youth as a heroin user. She reportedly was clean for over 11 years. Ronald Robertson went into deep depression the result of deep grief after his wife's death. He admits to isolating himself, and taking oxyc and drinking excessively. In a moment of clarity he found that he needed help and was admitted into [facility] for care. He is satisfied with his treatment and care. He felt he needed spiritual care also.  Discussions dealt with his need of support from those that can assist him not return to drugs and alcohol. He sees these substances as not beneficial to the life he wishes to live. Recommendations to him were to find a community of faith which will support him, as well as support groups such as NA. Lastly, it was recommended to him that he follow the path of his grief, allowing others to help as needed. He had been suppressing his grief by denial and then by drug use.  Prayer for forgiveness and asking for God's help to do what is right was prayed.  Recommend chaplain be paged should Ronald Robertson need further spiritual care.   Ronald Robertson, DMin Chaplain

## 2013-05-03 NOTE — B[facility] Suicide Risk Assessment (Signed)
Suicide Risk Assessment  Discharge Assessment     Demographic Factors:  Male and Caucasian  Mental Status Per Nursing Assessment::   On Admission:     Current Mental Status by Physician: In full contact with reality. There are no suicidal ideas, plans or intent. His mood is sad, anxious worried. His affect is appropriate. There are no active S/S of withdrawal   Loss Factors: Loss of significant relationship  Historical Factors: NA  Risk Reduction Factors:   Religious beliefs about death and Positive social support  Continued Clinical Symptoms:  Depression:   Comorbid alcohol abuse/dependence Alcohol/Substance Abuse/Dependencies  Cognitive Features That Contribute To Risk: None identified    Suicide Risk:  Minimal: No identifiable suicidal ideation.  Patients presenting with no risk factors but with morbid ruminations; may be classified as minimal risk based on the severity of the depressive symptoms  Discharge Diagnoses:   AXIS I:  Major Depression, Opioid, Alcohol Dependence AXIS II:  Deferred AXIS III:   Past Medical History  Diagnosis Date  . Hypertension    AXIS IV:  other psychosocial or environmental problems AXIS V:  61-70 mild symptoms  Plan Of Care/Follow-up recommendations:  Activity:  as tolerated Diet:  regular Will D/C to go to [facility] to further assessment of his tachycardia Is patient on multiple antipsychotic therapies at discharge:  No   Has Patient had three or more failed trials of antipsychotic monotherapy by history:  No  Recommended Plan for Multiple Antipsychotic Therapies: NA  Nero Sawatzky A 05/03/2013, 6:31 PM

## 2013-05-03 NOTE — Progress Notes (Signed)
Adult Psychoeducational Group Note  Date:  05/03/2013 Time:  3:17 PM  Group Topic/Focus:  Healthy Communication:   The focus of this group is to discuss communication, barriers to communication, as well as healthy ways to communicate with others.  Participation Level:  Did Not Attend  Additional Comments:  Pts are encouraged to attend all group sessions. Pt had no notable reason to miss group.   Tora Perches N 05/03/2013, 3:17 PM

## 2013-05-03 NOTE — Progress Notes (Signed)
Discussed case with Psych NP. Pt initially admitted for detox for ETOH and heroin abuse. Since admission on 04/29/13, the patient had been noted to be tachycardic with HR gradually rising. Pt was started on PO metoprolol but HR noted to be as high as 150's on the day of consult. Pt is on CIWA protocol, but unclear if he was given benzos. Pt is otherwise asymptomatic, per NP. EKG was done showing sinus tach.  Given hx of ETOH abuse, gave recommendation for fast acting benzo x 1 dose. Since we cannot give IV meds in inpt psych with limited access to phlebotomist, gave recommendation to d/c from psych and direct admit to [facility] stepdown floor for further work up.

## 2013-05-03 NOTE — Progress Notes (Signed)
Patient ID: Ronald Robertson, male   DOB: 12-13-1960, 52 y.o.   MRN: [id number]  D: Pt has been very tearful on the unit today, she reports that his heart is broken because of his wife's death. Pt reported that his life just hit the dumps and he didn't see any way out. Pt reported that his depression and hopelessness was a 10, and that his heart hurts everyday. Pt reported that he wanted to see a chaplain for help and guidance. A chaplain was called and patient was seen. Pt also reported that he was anxious and that his heart rate was up. Pts HR was in the 150's, and when manually done by this nurse it was 135. Dr. Dub Mikes made aware, he referred me to Verne Spurr PA for orders. New orders were written for a STAT EKG and a Internal Med consult. Per Internal Med doctor patient needed to be admitted to the stepdown unit at [facility], because will be transported. Pt reported that he was negative SI/HI, no AH/VH noted. A: 15 min checks continued for pt safety. R: Pt safety maintained.

## 2013-05-04 ENCOUNTER — Inpatient Hospital Stay (HOSPITAL_COMMUNITY): Payer: Self-pay

## 2013-05-04 ENCOUNTER — Inpatient Hospital Stay (HOSPITAL_COMMUNITY): Payer: BC Managed Care – PPO

## 2013-05-04 ENCOUNTER — Inpatient Hospital Stay (HOSPITAL_COMMUNITY)
Admission: AD | Admit: 2013-05-04 | Discharge: 2013-05-05 | DRG: 897 | Disposition: A | Discharge status: Home / Self Care | Payer: Self-pay | Source: Intra-hospital | Attending: Psychiatry | Admitting: Psychiatry

## 2013-05-04 ENCOUNTER — Encounter (HOSPITAL_COMMUNITY): Payer: Self-pay | Admitting: *Deleted

## 2013-05-04 DIAGNOSIS — Z79899 Other long term (current) drug therapy: Secondary | ICD-10-CM

## 2013-05-04 DIAGNOSIS — F329 Major depressive disorder, single episode, unspecified: Secondary | ICD-10-CM

## 2013-05-04 DIAGNOSIS — I10 Essential (primary) hypertension: Secondary | ICD-10-CM | POA: Diagnosis present

## 2013-05-04 DIAGNOSIS — Z733 Stress, not elsewhere classified: Secondary | ICD-10-CM

## 2013-05-04 DIAGNOSIS — F332 Major depressive disorder, recurrent severe without psychotic features: Secondary | ICD-10-CM

## 2013-05-04 DIAGNOSIS — F1020 Alcohol dependence, uncomplicated: Secondary | ICD-10-CM | POA: Diagnosis present

## 2013-05-04 DIAGNOSIS — F1010 Alcohol abuse, uncomplicated: Secondary | ICD-10-CM

## 2013-05-04 DIAGNOSIS — F1120 Opioid dependence, uncomplicated: Secondary | ICD-10-CM | POA: Diagnosis present

## 2013-05-04 DIAGNOSIS — F411 Generalized anxiety disorder: Secondary | ICD-10-CM | POA: Diagnosis present

## 2013-05-04 DIAGNOSIS — F10239 Alcohol dependence with withdrawal, unspecified: Principal | ICD-10-CM | POA: Diagnosis present

## 2013-05-04 DIAGNOSIS — Z8711 Personal history of peptic ulcer disease: Secondary | ICD-10-CM

## 2013-05-04 DIAGNOSIS — F1114 Opioid abuse with opioid-induced mood disorder: Secondary | ICD-10-CM | POA: Diagnosis present

## 2013-05-04 DIAGNOSIS — F4321 Adjustment disorder with depressed mood: Secondary | ICD-10-CM

## 2013-05-04 DIAGNOSIS — F10939 Alcohol use, unspecified with withdrawal, unspecified: Principal | ICD-10-CM | POA: Diagnosis present

## 2013-05-04 DIAGNOSIS — Z634 Disappearance and death of family member: Secondary | ICD-10-CM

## 2013-05-04 HISTORY — DX: Anxiety disorder, unspecified: F41.9

## 2013-05-04 HISTORY — DX: Major depressive disorder, single episode, unspecified: F32.9

## 2013-05-04 HISTORY — DX: Depression, unspecified: F32.A

## 2013-05-04 LAB — TROPONIN I: Troponin I: <0.3 ng/mL (ref <0.30)

## 2013-05-04 MED ORDER — SERTRALINE HCL 100 MG PO TABS
100.0000 mg | ORAL_TABLET | Freq: Every day | ORAL | Status: DC
  Administered 2013-05-05: 100 mg via ORAL
  Filled 2013-05-04 (×4): qty 1

## 2013-05-04 MED ORDER — BUSPIRONE HCL 15 MG PO TABS
15.0000 mg | ORAL_TABLET | Freq: Three times a day (TID) | ORAL | Status: DC
  Administered 2013-05-04 – 2013-05-05 (×3): 15 mg via ORAL
  Filled 2013-05-04 (×3): qty 1
  Filled 2013-05-04: qty 3
  Filled 2013-05-04 (×6): qty 1

## 2013-05-04 MED ORDER — TECHNETIUM TO 99M ALBUMIN AGGREGATED: 3.5000 | Freq: Once | INTRAVENOUS | Status: DC | PRN

## 2013-05-04 MED ORDER — METOPROLOL TARTRATE 50 MG PO TABS
50.0000 mg | ORAL_TABLET | Freq: Two times a day (BID) | ORAL | Status: DC
  Administered 2013-05-04: 50 mg via ORAL
  Filled 2013-05-04 (×2): qty 1

## 2013-05-04 MED ORDER — HYDROXYZINE HCL 50 MG PO TABS
50.0000 mg | ORAL_TABLET | Freq: Four times a day (QID) | ORAL | Status: DC | PRN
  Administered 2013-05-04 – 2013-05-05 (×3): 50 mg via ORAL
  Filled 2013-05-04 (×3): qty 1

## 2013-05-04 MED ORDER — ACETAMINOPHEN 325 MG PO TABS: 650.0000 mg | ORAL_TABLET | Freq: Four times a day (QID) | ORAL | Status: DC | PRN

## 2013-05-04 MED ORDER — METOPROLOL TARTRATE 50 MG PO TABS
50.0000 mg | ORAL_TABLET | Freq: Two times a day (BID) | ORAL | Status: DC
  Administered 2013-05-04 – 2013-05-05 (×2): 50 mg via ORAL
  Filled 2013-05-04: qty 1
  Filled 2013-05-04: qty 2
  Filled 2013-05-04 (×6): qty 1

## 2013-05-04 MED ORDER — METOPROLOL TARTRATE 50 MG PO TABS: 50.0000 mg | ORAL_TABLET | Freq: Two times a day (BID) | ORAL | Status: DC

## 2013-05-04 MED ORDER — GABAPENTIN 300 MG PO CAPS
300.0000 mg | ORAL_CAPSULE | Freq: Three times a day (TID) | ORAL | Status: DC
  Filled 2013-05-04 (×3): qty 1

## 2013-05-04 MED ORDER — GABAPENTIN 300 MG PO CAPS
300.0000 mg | ORAL_CAPSULE | Freq: Four times a day (QID) | ORAL | Status: DC
  Administered 2013-05-04 – 2013-05-05 (×4): 300 mg via ORAL
  Filled 2013-05-04 (×14): qty 1

## 2013-05-04 MED ORDER — MAGNESIUM HYDROXIDE 400 MG/5ML PO SUSP: 30.0000 mL | Freq: Every day | ORAL | Status: DC | PRN

## 2013-05-04 MED ORDER — ALUM & MAG HYDROXIDE-SIMETH 200-200-20 MG/5ML PO SUSP: 30.0000 mL | ORAL | Status: DC | PRN

## 2013-05-04 MED ORDER — ZOLPIDEM TARTRATE 10 MG PO TABS
10.0000 mg | ORAL_TABLET | Freq: Every evening | ORAL | Status: DC | PRN
  Administered 2013-05-04: 10 mg via ORAL
  Filled 2013-05-04: qty 1

## 2013-05-04 MED ORDER — LABETALOL HCL 5 MG/ML IV SOLN: 5.0000 mg | INTRAVENOUS | Status: DC | PRN

## 2013-05-04 MED ORDER — METHOCARBAMOL 500 MG PO TABS
500.0000 mg | ORAL_TABLET | Freq: Three times a day (TID) | ORAL | Status: DC
  Administered 2013-05-04 – 2013-05-05 (×3): 500 mg via ORAL
  Filled 2013-05-04 (×10): qty 1

## 2013-05-04 MED ORDER — TECHNETIUM TC 99M DIETHYLENETRIAME-PENTAACETIC ACID: 26.0000 | Freq: Once | INTRAVENOUS | Status: DC | PRN

## 2013-05-04 NOTE — Tx Team (Signed)
Initial Interdisciplinary Treatment Plan  PATIENT STRENGTHS: (choose at least two) Ability for insight Average or above average intelligence Capable of independent living Communication skills Motivation for treatment/growth  PATIENT STRESSORS: Loss of wife Substance abuse   PROBLEM LIST: Problem List/Patient Goals Date to be addressed Date deferred Reason deferred Estimated date of resolution  Substance Abuse 05/04/13     Depression/Grief 05/04/13                                                DISCHARGE CRITERIA:  Ability to meet basic life and health needs Improved stabilization in mood, thinking, and/or behavior Motivation to continue treatment in a less acute level of care Verbal commitment to aftercare and medication compliance  PRELIMINARY DISCHARGE PLAN: Attend aftercare/continuing care group Outpatient therapy Return to previous living arrangement  PATIENT/FAMIILY INVOLVEMENT: This treatment plan has been presented to and reviewed with the patient, BRANCH PACITTI, and/or family member, .  The patient and family have been given the opportunity to ask questions and make suggestions.  Meaghen Vecchiarelli, [place] 05/04/2013, 3:30 PM

## 2013-05-04 NOTE — Consult Note (Signed)
[facility] Face-to-Face Psychiatry Consult   Reason for Consult:  Anxiety depression and alcohol abuse Referring Physician:  Kindred Reidinger is an 52 y.o. male.  Assessment: AXIS I:  Major Depression, Recurrent severe and Alcohol abuse AXIS II:  Deferred AXIS III:   Past Medical History  Diagnosis Date  . Hypertension    AXIS IV:  other psychosocial or environmental problems, problems related to social environment and problems with primary support group AXIS V:  41-50 serious symptoms  Plan:  Recommend psychiatric Inpatient admission when medically cleared.  Subjective:   BRECKYN TICAS is a 52 y.o. male patient admitted with tachycardia.  HPI:  Patient was seen chart reviewed.  Patient is a 52 year old Caucasian man who was [facility] because of tachycardia.  He was admitted at behavioral Center because of alcohol dependence, cocaine dependence and significant depression and anxiety symptoms.  Patient continues to have these symptoms.  He continues to experience depressive thoughts however denies any suicidal thoughts.  He is taking BuSpar, gabapentin and Zoloft.  He is in the process of adjusting his medication.  He continues to have ruminations thoughts and talk about his wife who died a month ago from hepatitis.  Patient continues to have urged about relapse and like to go back to behavioral Health Center once he is medically cleared.  His vitals are more stable.  He denies any hallucination, paranoia or any aggression.  He has no homicidal thoughts or suicidal thoughts the HPI Elements:   Location:  Medical floor.  Past Psychiatric History: Past Medical History  Diagnosis Date  . Hypertension     reports that he has been smoking.  He does not have any smokeless tobacco history on file. He reports that he drinks about 126.0 ounces of alcohol per week. He reports that he uses illicit drugs (Marijuana). Family History  Problem Relation Age of Onset  .  Adopted: Yes     Living Arrangements: Alone   Abuse/Neglect [facility]) Physical Abuse: Denies Verbal Abuse: Denies Sexual Abuse: Denies Allergies:  No Known Allergies  ACT Assessment Complete:  Yes:    Educational Status    Risk to Self: Risk to self Is patient at risk for suicide?: No Substance abuse history and/or treatment for substance abuse?: Yes  Risk to Others:    Abuse: Abuse/Neglect Assessment (Assessment to be complete while patient is alone) Physical Abuse: Denies Verbal Abuse: Denies Sexual Abuse: Denies Exploitation of patient/patient's resources: Denies Self-Neglect: Yes, present (Comment) (wife's death)  Prior Inpatient Therapy:    Prior Outpatient Therapy:    Additional Information:                    Objective: Blood pressure 130/69, pulse 68, temperature 98.7 F (37.1 C), temperature source Oral, resp. rate 22, height 6\' 1"  (1.854 m), weight 250 lb 14.1 oz (113.8 kg), SpO2 92.00%.Body mass index is 33.11 kg/(m^2). Results for orders placed during the hospital encounter of 05/03/13 (from the past 72 hour(s))  BASIC METABOLIC PANEL     Status: None   Collection Time    05/03/13  5:24 PM      Result Value Range   Sodium 138  135 - 145 mEq/L   Potassium 3.8  3.5 - 5.1 mEq/L   Chloride 103  96 - 112 mEq/L   CO2 26  19 - 32 mEq/L   Glucose, Bld 88  70 - 99 mg/dL   BUN 7  6 - 23  mg/dL   Creatinine, Ser 8.11  0.50 - 1.35 mg/dL   Calcium 9.0  8.4 - 91.4 mg/dL   GFR calc non Af Amer >90  >90 mL/min   GFR calc Af Amer >90  >90 mL/min   Comment: (NOTE)     The eGFR has been calculated using the CKD EPI equation.     This calculation has not been validated in all clinical situations.     eGFR's persistently <90 mL/min signify possible Chronic Kidney     Disease.  CBC     Status: None   Collection Time    05/03/13  5:24 PM      Result Value Range   WBC 8.7  4.0 - 10.5 K/uL   RBC 4.79  4.22 - 5.81 MIL/uL   Hemoglobin 15.8  13.0 - 17.0 g/dL   HCT  78.2  95.6 - 21.3 %   MCV 93.7  78.0 - 100.0 fL   MCH 33.0  26.0 - 34.0 pg   MCHC 35.2  30.0 - 36.0 g/dL   RDW 08.6  57.8 - 46.9 %   Platelets 201  150 - 400 K/uL  TSH     Status: None   Collection Time    05/03/13  5:24 PM      Result Value Range   TSH 1.994  0.350 - 4.500 uIU/mL   Comment: Performed at [facility]  TROPONIN I     Status: None   Collection Time    05/03/13  5:24 PM      Result Value Range   Troponin I <0.30  <0.30 ng/mL   Comment:            Due to the release kinetics of cTnI,     a negative result within the first hours     of the onset of symptoms does not rule out     myocardial infarction with certainty.     If myocardial infarction is still suspected,     repeat the test at appropriate intervals.  D-DIMER, QUANTITATIVE     Status: Abnormal   Collection Time    05/03/13  5:24 PM      Result Value Range   D-Dimer, Quant 0.97 (*) 0.00 - 0.48 ug/mL-FEU   Comment:            AT THE INHOUSE ESTABLISHED CUTOFF     VALUE OF 0.48 ug/mL FEU,     THIS ASSAY HAS BEEN DOCUMENTED     IN THE LITERATURE TO HAVE     A SENSITIVITY AND NEGATIVE     PREDICTIVE VALUE OF AT LEAST     98 TO 99%.  THE TEST RESULT     SHOULD BE CORRELATED WITH     AN ASSESSMENT OF THE CLINICAL     PROBABILITY OF DVT / VTE.  MRSA PCR SCREENING     Status: None   Collection Time    05/03/13  6:01 PM      Result Value Range   MRSA by PCR NEGATIVE  NEGATIVE   Comment:            The GeneXpert MRSA Assay (FDA     approved for NASAL specimens     only), is one component of a     comprehensive MRSA colonization     surveillance program. It is not     intended to diagnose MRSA     infection nor to guide or  monitor treatment for     MRSA infections.  TROPONIN I     Status: None   Collection Time    05/03/13 11:12 PM      Result Value Range   Troponin I <0.30  <0.30 ng/mL   Comment:            Due to the release kinetics of cTnI,     a negative result within the first  hours     of the onset of symptoms does not rule out     myocardial infarction with certainty.     If myocardial infarction is still suspected,     repeat the test at appropriate intervals.  TROPONIN I     Status: None   Collection Time    05/04/13  5:30 AM      Result Value Range   Troponin I <0.30  <0.30 ng/mL   Comment:            Due to the release kinetics of cTnI,     a negative result within the first hours     of the onset of symptoms does not rule out     myocardial infarction with certainty.     If myocardial infarction is still suspected,     repeat the test at appropriate intervals.   Labs are reviewed and are pertinent for tachycardia, UDS positive for marijuana.  Current Facility-Administered Medications  Medication Dose Route Frequency Provider Last Rate Last Dose  . 0.9 %  sodium chloride infusion   Intravenous Continuous Kela Millin, MD 100 mL/hr at 05/04/13 0530    . acetaminophen (TYLENOL) tablet 650 mg  650 mg Oral Q6H PRN Adeline C Viyuoh, MD       Or  . acetaminophen (TYLENOL) suppository 650 mg  650 mg Rectal Q6H PRN Adeline C Viyuoh, MD      . aspirin EC tablet 81 mg  81 mg Oral Daily Adeline C Viyuoh, MD   81 mg at 05/04/13 0930  . busPIRone (BUSPAR) tablet 15 mg  15 mg Oral TID Kela Millin, MD   15 mg at 05/04/13 0930  . enoxaparin (LOVENOX) injection 55 mg  55 mg Subcutaneous Q24H Kela Millin, MD   55 mg at 05/03/13 1838  . folic acid (FOLVITE) tablet 1 mg  1 mg Oral Daily Adeline C Viyuoh, MD   1 mg at 05/04/13 0930  . gabapentin (NEURONTIN) capsule 300 mg  300 mg Oral TID AC & HS Jerald Kief, MD      . hydrOXYzine (ATARAX/VISTARIL) tablet 50 mg  50 mg Oral QID Jerald Kief, MD   50 mg at 05/04/13 0931  . labetalol (NORMODYNE,TRANDATE) injection 5 mg  5 mg Intravenous Q10 min PRN Jerald Kief, MD      . methocarbamol (ROBAXIN) tablet 500 mg  500 mg Oral Q8H Kela Millin, MD   500 mg at 05/04/13 0524  . metoprolol (LOPRESSOR)  tablet 50 mg  50 mg Oral BID Jerald Kief, MD   50 mg at 05/04/13 0930  . multivitamin with minerals tablet 1 tablet  1 tablet Oral Daily Kela Millin, MD   1 tablet at 05/04/13 0930  . ondansetron (ZOFRAN) tablet 4 mg  4 mg Oral Q6H PRN Adeline C Viyuoh, MD       Or  . ondansetron (ZOFRAN) injection 4 mg  4 mg Intravenous Q6H PRN Kela Millin, MD      .  sertraline (ZOLOFT) tablet 100 mg  100 mg Oral Daily Adeline C Viyuoh, MD   100 mg at 05/04/13 0930  . sodium chloride 0.9 % injection 3 mL  3 mL Intravenous Q12H Adeline C Viyuoh, MD      . technetium albumin aggregated (MAA) injection solution 4 milli Curie  4 milli Curie Intravenous Once PRN Medication Radiologist, MD      . technetium TC 76M diethylenetriame-pentaacetic acid (DTPA) injection 26 milli Curie  26 milli Curie Intravenous Once PRN Medication Radiologist, MD      . thiamine (VITAMIN B-1) tablet 100 mg  100 mg Oral Daily Adeline C Viyuoh, MD   100 mg at 05/04/13 0930  . zolpidem (AMBIEN) tablet 10 mg  10 mg Oral QHS PRN Kela Millin, MD   10 mg at 05/03/13 2107    Psychiatric Specialty Exam:     Blood pressure 130/69, pulse 68, temperature 98.7 F (37.1 C), temperature source Oral, resp. rate 22, height 6\' 1"  (1.854 m), weight 250 lb 14.1 oz (113.8 kg), SpO2 92.00%.Body mass index is 33.11 kg/(m^2).  General Appearance: Casual  Eye Contact::  Fair  Speech:  Normal Rate  Volume:  Decreased  Mood:  Anxious, Depressed and Dysphoric  Affect:  Constricted  Thought Process:  Logical  Orientation:  Full (Time, Place, and Person)  Thought Content:  Rumination  Suicidal Thoughts:  No  Homicidal Thoughts:  No  Memory:  Negative  Judgement:  Fair  Insight:  Fair  Psychomotor Activity:  Decreased  Concentration:  Fair  Recall:  Good  Akathisia:  No  Handed:  Right  AIMS (if indicated):     Assets:  Communication Skills Desire for Improvement  Sleep:      Treatment Plan Summary: Patient will be transferred  back to behavioral [facility] once he is medically cleared continue BuSpar Wellbutrin and Zoloft.  Sorren Vallier T. 05/04/2013 1:04 PM

## 2013-05-04 NOTE — Progress Notes (Signed)
D: Patient observed on unit. Patient c/o anxiety and was concerned about sleeping through the night . Patient also displayed anxiety about changes in his medication orders. Denies S/I, H/I. Patient denies AH/VH. Patient attended group sessions and interacted with peers.  A: Support and encouragement offered. Medications administered as ordered along with Ambien and Vistaril. Q15 minute checks observed for safety.    R:Patient contracts for safety. Patient safety maintained on the unit.  Carrolyn Leigh, RN BSN 05/04/2013 11:25 PM

## 2013-05-04 NOTE — Progress Notes (Signed)
Pt re-admitted to [facility] from medical floor after treatment for sinus tachycardia. Pt vital signs stable upon admission, states that he is feeling better today than yesterday. Pt denies any SI/HI upon arrival back to [facility] and does not express any complaints or needs upon arrival. Pt was re-oriented to unit and safety maintained.

## 2013-05-04 NOTE — Discharge Summary (Signed)
Physician Discharge Summary  Ronald Robertson ZOX:[id number] DOB: 1961-01-22 DOA: 05/03/2013  PCP: Default, Provider, MD  Admit date: 05/03/2013 Discharge date: 05/04/2013  Time spent: 35 minutes  Recommendations for Outpatient Follow-up:  1. Return to Inpatient Psychiatry to complete treatment 2. Follow up with PCP in 1-2 weeks post hospital discharge  Discharge Diagnoses:  Active Problems:   Major depression   Alcohol abuse   Opioid abuse with opioid-induced mood disorder   Tachycardia   HTN (hypertension), benign   Discharge Condition: Improved  Diet recommendation: Low sodium  Filed Weights   05/03/13 1600 05/04/13 0400  Weight: 112.5 kg (248 lb 0.3 oz) 113.8 kg (250 lb 14.1 oz)    History of present illness:  Ronald Robertson is a 52 y.o. male past medical history significant for hypertension, alcohol abuse, depression who was admitted to [facility] on 10/12 for alcohol and opiod detox and today he noted per nursing to be tachycardic to the 150s and transferred to [facility] long further evaluation and management. The patient states that since his admission 10/12 at [facility] every morning his heart rate goes up to the 150s and improves as the day goes by and this was attributed to his alcohol detox. Today he reports he got tachycardic again this a.m., and the nursing staff noted that he has been detoxed for about a week now(per admission H&P had been on the prominent clonidine our protocols), but was still tachycardic and so hospitalist service are was called for admission. It is noted that he was started on metoprolol 12.5 twice a day today at [facility] for hypertension and also to help with her tachycardia. EKG done prior to transfer to [facility] long showed sinus tachycardia.His heart rate so far in the step down unit has stayed in the 90s The patient denies chest pain, shortness of breath, cough, nausea, vomiting, fevers, dysuria, leg swelling and no leg pain. As already mentioned he's had no alcohol or  opiates in the past week a [facility] and had been placed on Librium and clonidine protocols day or as appropriate. Patient states he's had increased anxiety and attributes his tachycardia to this.  Hospital Course:  Tachycardia, sinus  -patient with alcohol/opiate abuse who has been detoxed as per protocol at [facility]. for the past one week. He clinically has no other evidence of withdrawal at this time.  -Cardiac enzymes neg x3 -obtained d-dimer -> mildly elevated at 0.97.  - Obtained VQ which was neg for PE  - 2D echo was done with results to be f/u as an outpatient -TSH Normal  - Increased metoprolol from 12.5mg  bid to 50mg  po bid with normalization of heart rate and improvement in blood pressure  Major depression/anxiety  -Continue meds as per psychiatrist-buspirone, hydroxyzine, Zoloft  Alcohol abuse  -Status post detox as per protocol at the site for the past one week as discussed above  -Place on MVI, thiamine, and he turned further treat accordingly.  Opioid abuse with opioid-induced mood disorder  -S/P detox at [facility] as per protocol the past one week  -Continue Robaxin, Tylenol when necessary. Continue avoiding opiates  HTN (hypertension), benign  -Increased metoprolol  Procedures:  VQ - neg for PE  Cardiac enzymes neg x 3  2d echo - results pending - to be followed up as an outpatient  Discharge Exam: Filed Vitals:   05/04/13 0700 05/04/13 0752 05/04/13 0800 05/04/13 1200  BP: 154/89 176/102 151/87 130/69  Pulse: 113 111 108 68  Temp:   98.9 F (  37.2 C) 98.7 F (37.1 C)  TempSrc:   Oral Oral  Resp: 18 17 32 22  Height:      Weight:      SpO2: 92% 94% 91% 92%    General: Awake, in nad Cardiovascular: regular, s1, s2 Respiratory: normal resp effort, no wheezing  Discharge Instructions     Medication List         busPIRone 15 MG tablet  Commonly known as:  BUSPAR  Take 1 tablet (15 mg total) by mouth 3 (three) times daily.     gabapentin 300 MG capsule  Commonly  known as:  NEURONTIN  Take 1 capsule 3 x a day and 1 at bedtime for anxiety and neuropathic pain.     hydrOXYzine 50 MG capsule  Commonly known as:  VISTARIL  Take 50 mg by mouth every 6 (six) hours.     methocarbamol 500 MG tablet  Commonly known as:  ROBAXIN  Take 500 mg by mouth every 8 (eight) hours.     metoprolol 50 MG tablet  Commonly known as:  LOPRESSOR  Take 1 tablet (50 mg total) by mouth 2 (two) times daily.     sertraline 100 MG tablet  Commonly known as:  ZOLOFT  Take 1 tablet (100 mg total) by mouth daily. For anxiety and depression.     zolpidem 10 MG tablet  Commonly known as:  AMBIEN  Take 10 mg by mouth at bedtime as needed for sleep.       No Known Allergies Follow-up Information   Follow up with Default, Provider, MD. Schedule an appointment as soon as possible for a visit in 2 weeks. (after hospital discharge)    Contact information:   [address] [place] [address] [phone]        The results of significant diagnostics from this hospitalization (including imaging, microbiology, ancillary and laboratory) are listed below for reference.    Significant Diagnostic Studies: Dg Chest 1 View  05/04/2013   CLINICAL DATA:  Hypertension. Tobacco use.  EXAM: CHEST - 1 VIEW  COMPARISON:  08/21/2011  FINDINGS: Normal heart size. Linear atelectasis at the left base. Lungs otherwise clear. No pneumothorax.  IMPRESSION: No active cardiopulmonary disease.   Electronically Signed   By: Maryclare Bean M.D.   On: 05/04/2013 09:36   Nm Pulmonary Perf And Vent  05/04/2013   CLINICAL DATA:  Chest pain. Elevated D-dimer.  EXAM: NUCLEAR MEDICINE VENTILATION - PERFUSION LUNG SCAN  TECHNIQUE: Ventilation images were obtained in multiple projections using inhaled aerosol technetium 99 M DTPA. Perfusion images were obtained in multiple projections after intravenous injection of Tc-46m MAA.  COMPARISON:  Chest x-ray 05/04/2013.  RADIOPHARMACEUTICALS:  26.0 mCi Tc-71m DTPA  aerosol and 3.5 mCi Tc-55m MAA  FINDINGS: Ventilation: No focal ventilation defect.  Perfusion: No wedge shaped peripheral perfusion defects to suggest acute pulmonary embolism.  IMPRESSION: Negative V/Q scan for pulmonary embolism.   Electronically Signed   By: Loralie Champagne M.D.   On: 05/04/2013 11:43    Microbiology: Recent Results (from the past 240 hour(s))  MRSA PCR SCREENING     Status: None   Collection Time    05/03/13  6:01 PM      Result Value Range Status   MRSA by PCR NEGATIVE  NEGATIVE Final   Comment:            The GeneXpert MRSA Assay (FDA     approved for NASAL specimens     only),  is one component of a     comprehensive MRSA colonization     surveillance program. It is not     intended to diagnose MRSA     infection nor to guide or     monitor treatment for     MRSA infections.     Labs: Basic Metabolic Panel:  Recent Labs Lab 04/27/13 1922 05/03/13 1724  NA 138 138  K 4.0 3.8  CL 103 103  CO2 28 26  GLUCOSE 116* 88  BUN 11 7  CREATININE 0.81 0.64  CALCIUM 8.5 9.0   Liver Function Tests:  Recent Labs Lab 04/27/13 1922  AST 70*  ALT 69*  ALKPHOS 72  BILITOT 0.7  PROT 6.1  ALBUMIN 2.9*   No results found for this basename: LIPASE, AMYLASE,  in the last 168 hours No results found for this basename: AMMONIA,  in the last 168 hours CBC:  Recent Labs Lab 05/03/13 1724  WBC 8.7  HGB 15.8  HCT 44.9  MCV 93.7  PLT 201   Cardiac Enzymes:  Recent Labs Lab 05/03/13 1724 05/03/13 2312 05/04/13 0530  TROPONINI <0.30 <0.30 <0.30   BNP: BNP (last 3 results) No results found for this basename: PROBNP,  in the last 8760 hours CBG: No results found for this basename: GLUCAP,  in the last 168 hours     Signed:  CHIU, STEPHEN K  [facility] 05/04/2013, 12:58 PM

## 2013-05-04 NOTE — Progress Notes (Signed)
Echocardiogram 2D Echocardiogram has been performed.  Ronald Robertson 05/04/2013, 10:56 AM

## 2013-05-04 NOTE — Progress Notes (Signed)
[facility] PROGRESS NOTE  Ronald Robertson:[id number] DOB: 1960/12/07 DOA: 05/03/2013 PCP: Default, Provider, MD  Assessment/Plan: Tachycardia, sinus  -patient with alcohol/opiate abuse who has been detoxed as per protocol at [facility]. for the past one week. He clinically has no other evidence of withdrawal at this time.  -Cardiac enzymes neg -obtain d-dimer mildly elevated at 0.97. Will obtain vq to r/o PE - 2D echo pending -TSH Normal - Will increase metoprolol as tolerated and add PRN IV beta blocker Major depression/anxiety  -Continue meds as per psychiatrist-buspirone, hydroxyzine, Zoloft  Alcohol abuse  -Status post detox as per protocol at the site for the past one week as discussed above  -Place on MVI, thiamine, and he turned further treat accordingly.  Opioid abuse with opioid-induced mood disorder  -S/P detox at [facility] as per protocol the past one week  -Continue Robaxin, Tylenol when necessary. Continue avoiding opiates  HTN (hypertension), benign  -Increase metoprolol  Code Status: Full Family Communication: Pt in room (indicate person spoken with, relationship, and if by phone, the number) Disposition Plan: Pending   HPI/Subjective: No acute events noted overnight. Reports feeling well.  Objective: Filed Vitals:   05/04/13 0500 05/04/13 0600 05/04/13 0700 05/04/13 0752  BP: 148/85 152/87 154/89 176/102  Pulse: 108 113 113 111  Temp:      TempSrc:      Resp: 32 30 18 17   Height:      Weight:      SpO2: 93% 92% 92% 94%    Intake/Output Summary (Last 24 hours) at 05/04/13 0814 Last data filed at 05/04/13 0800  Gross per 24 hour  Intake   2000 ml  Output   2375 ml  Net   -375 ml   Filed Weights   05/03/13 1600 05/04/13 0400  Weight: 112.5 kg (248 lb 0.3 oz) 113.8 kg (250 lb 14.1 oz)    Exam:   General:  Awake, in nad  Cardiovascular: regular, tachycardic  Respiratory: normal resp effort, no wheezing  Abdomen: soft,  nondistended  Musculoskeletal: perfused, no clubbing   Data Reviewed: Basic Metabolic Panel:  Recent Labs Lab 04/27/13 1922 05/03/13 1724  NA 138 138  K 4.0 3.8  CL 103 103  CO2 28 26  GLUCOSE 116* 88  BUN 11 7  CREATININE 0.81 0.64  CALCIUM 8.5 9.0   Liver Function Tests:  Recent Labs Lab 04/27/13 1922  AST 70*  ALT 69*  ALKPHOS 72  BILITOT 0.7  PROT 6.1  ALBUMIN 2.9*   No results found for this basename: LIPASE, AMYLASE,  in the last 168 hours No results found for this basename: AMMONIA,  in the last 168 hours CBC:  Recent Labs Lab 05/03/13 1724  WBC 8.7  HGB 15.8  HCT 44.9  MCV 93.7  PLT 201   Cardiac Enzymes:  Recent Labs Lab 05/03/13 1724 05/03/13 2312 05/04/13 0530  TROPONINI <0.30 <0.30 <0.30   BNP (last 3 results) No results found for this basename: PROBNP,  in the last 8760 hours CBG: No results found for this basename: GLUCAP,  in the last 168 hours  Recent Results (from the past 240 hour(s))  MRSA PCR SCREENING     Status: None   Collection Time    05/03/13  6:01 PM      Result Value Range Status   MRSA by PCR NEGATIVE  NEGATIVE Final   Comment:            The GeneXpert MRSA Assay (FDA  approved for NASAL specimens     only), is one component of a     comprehensive MRSA colonization     surveillance program. It is not     intended to diagnose MRSA     infection nor to guide or     monitor treatment for     MRSA infections.     Studies: No results found.  Scheduled Meds: . aspirin EC  81 mg Oral Daily  . busPIRone  15 mg Oral TID  . enoxaparin (LOVENOX) injection  55 mg Subcutaneous Q24H  . folic acid  1 mg Oral Daily  . gabapentin  300 mg Oral TID  . hydrOXYzine  50 mg Oral QID  . influenza vac split quadrivalent PF  0.5 mL Intramuscular Tomorrow-1000  . methocarbamol  500 mg Oral Q8H  . metoprolol tartrate  50 mg Oral BID  . multivitamin with minerals  1 tablet Oral Daily  . sertraline  100 mg Oral Daily  .  sodium chloride  3 mL Intravenous Q12H  . thiamine  100 mg Oral Daily   Continuous Infusions: . sodium chloride 100 mL/hr at 05/04/13 0530    Active Problems:   Major depression   Alcohol abuse   Opioid abuse with opioid-induced mood disorder   Tachycardia   HTN (hypertension), benign  Time spent:  Gabreil Yonkers K  [facility] Pager [phone]. If 7PM-7AM, please contact night-coverage at [email or web address], password [facility] 05/04/2013, 8:14 AM  LOS: 1 day

## 2013-05-05 DIAGNOSIS — F1020 Alcohol dependence, uncomplicated: Secondary | ICD-10-CM

## 2013-05-05 DIAGNOSIS — F1994 Other psychoactive substance use, unspecified with psychoactive substance-induced mood disorder: Secondary | ICD-10-CM

## 2013-05-05 DIAGNOSIS — F329 Major depressive disorder, single episode, unspecified: Secondary | ICD-10-CM

## 2013-05-05 MED ORDER — BUSPIRONE HCL 15 MG PO TABS: 15.0000 mg | ORAL_TABLET | Freq: Three times a day (TID) | ORAL | Status: DC

## 2013-05-05 MED ORDER — GABAPENTIN 300 MG PO CAPS: 300.0000 mg | ORAL_CAPSULE | Freq: Four times a day (QID) | ORAL | Status: AC

## 2013-05-05 MED ORDER — SERTRALINE HCL 100 MG PO TABS: 100.0000 mg | ORAL_TABLET | Freq: Every day | ORAL | Status: DC

## 2013-05-05 MED ORDER — SERTRALINE HCL 100 MG PO TABS: 100.0000 mg | ORAL_TABLET | Freq: Every day | ORAL | Status: AC

## 2013-05-05 MED ORDER — HYDROXYZINE HCL 50 MG PO TABS: 50.0000 mg | ORAL_TABLET | Freq: Four times a day (QID) | ORAL | Status: DC | PRN

## 2013-05-05 MED ORDER — ZOLPIDEM TARTRATE 10 MG PO TABS: 10.0000 mg | ORAL_TABLET | Freq: Every evening | ORAL | Status: AC | PRN

## 2013-05-05 MED ORDER — METOPROLOL TARTRATE 50 MG PO TABS: 50.0000 mg | ORAL_TABLET | Freq: Two times a day (BID) | ORAL | Status: AC

## 2013-05-05 MED ORDER — BUSPIRONE HCL 15 MG PO TABS: 15.0000 mg | ORAL_TABLET | Freq: Three times a day (TID) | ORAL | Status: AC

## 2013-05-05 MED ORDER — METOPROLOL TARTRATE 50 MG PO TABS: 50.0000 mg | ORAL_TABLET | Freq: Two times a day (BID) | ORAL | Status: DC

## 2013-05-05 MED ORDER — METHOCARBAMOL 500 MG PO TABS: 500.0000 mg | ORAL_TABLET | Freq: Three times a day (TID) | ORAL | Status: AC

## 2013-05-05 NOTE — [facility] Group Notes (Signed)
[facility] LCSW Group Therapy  05/05/2013 3:34 PM  Type of Therapy:  Group Therapy  Participation Level:  Active  Participation Quality:  Attentive  Affect:  Appropriate  Cognitive:  Alert and Oriented  Insight:  Engaged  Engagement in Therapy:  Engaged  Modes of Intervention:  Discussion, Education, Exploration, Socialization and Support  Summary of Progress/Problems: Today's Topic: Overcoming Obstacles. Pt identified obstacles faced currently and processed barriers involved in overcoming these obstacles. Pt identified steps necessary for overcoming these obstacles and explored motivation (internal and external) for facing these difficulties head on. Pt further identified one area of concern in their lives and chose a skill of focus pulled from their "toolbox." Koleman was engaged and attentive throughout today's therapy group. Koree shared that his biggest obstacle is "dealing with the grief and pain of coming home to an empty house and losing my wife." Uriyah shows progress in the group setting and improving insight AEB his ability to process how using drugs and alcohol to numb the pain of his wife's passing has stopped him from going through the grieving process. "I am in a place now where I am ready to face this grief, allow myself to ask for help, and get some sense of closure." Kirk plans to attend bereavement counseling through Hosp[facility].    Smart, Janaiah Vetrano 05/05/2013, 3:34 PM

## 2013-05-05 NOTE — [facility] Group Notes (Signed)
[facility] LCSW Aftercare Discharge Planning Group Note   05/05/2013 9:40 AM  Participation Quality:  Appropriate   Mood/Affect:  Appropriate  Depression Rating:  0  Anxiety Rating:  2  Thoughts of Suicide:  No Will you contract for safety?   NA  Current AVH:  No  Plan for Discharge/Comments:  Pt reports that he went to [facility] on Saturday to be treated for high pulse. Pt reports that his vitals have since returned to normal and he feels "much better." Pt reports that he is ready for d/c today and to "resume living my life." Pt will follow up with his PCP tomorrow and plans to get set up with [facility]osp[facility] for bereavement counseling next week.   Transportation Means: friend   Supports: friends/community supports   Counselling psychologist, [employer]

## 2013-05-05 NOTE — Discharge Summary (Signed)
Physician Discharge Summary Note  Patient:  Ronald Robertson is an 52 y.o., male MRN:  [id number] DOB:  27-Jul-1960 Patient phone:  [phone] (home)  Patient address:   [address] [place] [address],   Date of Admission:  04/26/2013 Date of Discharge: 05/05/2013  Reason for Admission: Substance abuse  Discharge Diagnoses: Active Problems:   Major depression   Bereavement   Alcohol abuse   Opioid abuse with opioid-induced mood disorder  ROS  DSM5: Assessment:  DSM5:  Schizophrenia Disorders:  Obsessive-Compulsive Disorders:  Trauma-Stressor Disorders:  Substance/Addictive Disorders: Alcohol Related Disorder - Severe (303.90) and Cannabis Use Disorder - Mild (305.20)  Depressive Disorders: Major Depressive Disorder - Severe (296.23)  AXIS I: Bereavement  AXIS II: Deferred  AXIS III:  Past Medical History   Diagnosis  Date   .  Hypertension     AXIS IV: occupational problems, other psychosocial or environmental problems and problems with primary support group  AXIS V: 41-50 serious symptoms   Level of Care:  In patient Medical unit   Hospital Course:  Patient was noted to be consistently tachycardic since his admission with vague chest discomfort. He was discharged out to the medical floor after phone consultation with Dr. Red Christians. He was to be admitted to a step down unit for further observation and evaluation.  Consults:  Internal medicine  Significant Diagnostic Studies:  cardiac graphics: ECG: 12 lead done on adult unit at [facility]  Discharge Vitals:   Blood pressure 126/74, pulse 125, temperature 97.5 F (36.4 C), temperature source Oral, resp. rate 17, height 5\' 11"  (1.803 m), weight 107.502 kg (237 lb), SpO2 95.00%. Body mass index is 33.07 kg/(m^2). Lab Results:   No results found for this or any previous visit (from the past 72 hour(s)).  Physical Findings: AIMS: Facial and Oral Movements Muscles of Facial Expression: None, normal Lips and  Perioral Area: None, normal Jaw: None, normal Tongue: None, normal,Extremity Movements Upper (arms, wrists, hands, fingers): None, normal Lower (legs, knees, ankles, toes): None, normal, Trunk Movements Neck, shoulders, hips: None, normal, Overall Severity Severity of abnormal movements (highest score from questions above): None, normal Incapacitation due to abnormal movements: None, normal Patient's awareness of abnormal movements (rate only patient's report): No Awareness, Dental Status Current problems with teeth and/or dentures?: No Does patient usually wear dentures?: No  CIWA:  CIWA-Ar Total: 5 COWS:  COWS Total Score: 3  Psychiatric Specialty Exam: See Psychiatric Specialty Exam and Suicide Risk Assessment completed by Attending Physician prior to discharge.  Discharge destination:  Other:  in patient medical floor  Is patient on multiple antipsychotic therapies at discharge:  No   Has Patient had three or more failed trials of antipsychotic monotherapy by history:  No  Recommended Plan for Multiple Antipsychotic Therapies: NA  Discharge Orders   Future Orders Complete By Expires   Diet - low sodium heart healthy  As directed    Discharge instructions  As directed    Comments:     Take all of your medications as directed. Be sure to keep all of your follow up appointments.  If you are unable to keep your follow up appointment, call your Doctor's office to let them know, and reschedule.  Make sure that you have enough medication to last until your appointment. Be sure to get plenty of rest. Going to bed at the same time each night will help. Try to avoid sleeping during the day.  Increase your activity as tolerated. Regular exercise will  help you to sleep better and improve your mental health. Eating a heart healthy diet is recommended. Try to avoid salty or fried foods. Be sure to avoid all alcohol and illegal drugs.   Increase activity slowly  As directed         Medication List    STOP taking these medications       sertraline 25 MG tablet  Commonly known as:  ZOLOFT           Follow-up Information   Follow up with [facility]-Dr. Maretta Bees On 05/06/2013. (Walk in between 8AM-11AM for hospital follow up. )    Contact information:   [address] [place], [place] [address] Phone: [phone] Fax: [phone]      Follow-up recommendations:    Comments:  Patient will return to [facility] when his evaluation is complete.  Total Discharge Time:  Less than 30 minutes.  Signed: MASHBURN,NEIL 05/05/2013, 11:56 PM Agree with assessment/recommnedations  and plan to return to [facility] after cardiac evaluation Danna Casella A. Dub Mikes, M.D.

## 2013-05-05 NOTE — [facility] Suicide Risk Assessment (Signed)
Suicide Risk Assessment  Discharge Assessment     Demographic Factors:  Male, Adolescent or young adult, Caucasian and Living alone  Mental Status Per Nursing Assessment::   On Admission:     Current Mental Status by Physician: Patient is calm, and cooperative. He has normal psychomotor activity. He has good mood and bright affect. He has normal speech and thought process. He has no suicidal or homicidal ideations, intention or plans. He has no evidence of psychosis.   Loss Factors: Financial problems/change in socioeconomic status  Historical Factors: Impulsivity  Risk Reduction Factors:   Sense of responsibility to family, Religious beliefs about death, Positive social support, Positive therapeutic relationship and Positive coping skills or problem solving skills  Continued Clinical Symptoms:  Depression:   Comorbid alcohol abuse/dependence Impulsivity Recent sense of peace/wellbeing Alcohol/Substance Abuse/Dependencies Unstable or Poor Therapeutic Relationship Medical Diagnoses and Treatments/Surgeries  Cognitive Features That Contribute To Risk:  Polarized thinking    Suicide Risk:  Minimal: No identifiable suicidal ideation.  Patients presenting with no risk factors but with morbid ruminations; may be classified as minimal risk based on the severity of the depressive symptoms  Discharge Diagnoses:   AXIS I:  Major Depression, Recurrent severe and Alcohol and opioid abuse vs dependence AXIS II:  Deferred AXIS III:   Past Medical History  Diagnosis Date  . Hypertension   . Depression   . Anxiety    AXIS IV:  occupational problems, other psychosocial or environmental problems, problems related to social environment and problems with primary support group AXIS V:  61-70 mild symptoms  Plan Of Care/Follow-up recommendations:  Activity:  As tolerated Diet:  Regular  Is patient on multiple antipsychotic therapies at discharge:  No   Has Patient had three or more  failed trials of antipsychotic monotherapy by history:  No  Recommended Plan for Multiple Antipsychotic Therapies: NA  Bobbette Eakes,JANARDHAHA R., MD 05/05/2013, 11:16 AM

## 2013-05-05 NOTE — Progress Notes (Signed)
Patient ID: Ronald Robertson, male   DOB: [birth year], 53 y.o.   MRN: [id number] He was discharged home and all belongs were taken with him.  He denies thoughts of SI. Stated that " He was going home and then go get his dog then go to a meeting".

## 2013-05-05 NOTE — Discharge Summary (Signed)
Physician Discharge Summary Note  Patient:  Ronald Robertson is an 52 y.o., male MRN:  [id number] DOB:  April 10, 1961 Patient phone:  [phone] (home)  Patient address:   [address] [place] [address],   Date of Admission:  05/04/2013 Date of Discharge: 05/05/2013  Reason for Admission:  Alcohol withdrawal/dependency  Discharge Diagnoses: Principal Problem:   Alcohol dependency Active Problems:   Major depression   Bereavement   Alcohol abuse   Opioid abuse with opioid-induced mood disorder  Review of Systems  Constitutional: Negative.   HENT: Negative.   Eyes: Negative.   Respiratory: Negative.   Cardiovascular: Negative.   Gastrointestinal: Negative.   Genitourinary: Negative.   Musculoskeletal: Negative.   Skin: Negative.   Neurological: Negative.   Endo/Heme/Allergies: Negative.   Psychiatric/Behavioral: Positive for depression and substance abuse. The patient is nervous/anxious.     DSM5:  Substance/Addictive Disorders:  Alcohol Related Disorder - Severe (303.90) Depressive Disorders:  Major Depressive Disorder - Severe (296.23)  Axis Diagnosis:   AXIS I:  Alcohol Abuse, Anxiety Disorder NOS, Bereavement and Major Depression, Recurrent severe AXIS II:  Deferred AXIS III:   Past Medical History  Diagnosis Date  . Hypertension   . Depression   . Anxiety    AXIS IV:  other psychosocial or environmental problems, problems related to social environment and problems with primary support group AXIS V:  61-70 mild symptoms  Level of Care:  OP  Hospital Course:  On admission:  52 year old male who presented to the [facility] voluntarily for detox from alcohol and opiates. Patient states "My wife died on month ago from hepatitis. She got it years ago when she had used heroine. Her death was unexpected. I haven't been coping well since. I've been drinking 30 beers per day, smoking THC and misusing my pain medications. My friends became concerned about me.  Because it's not like me to sit there and do this all day. I don't think I wanted to die but part of me thought it would be nice to be with my wife. I knew I could not live that way. I have not been taking care of myself. When I gave a urine sample at the hospital it was brown. I was not eating or drinking anything other than beer. I'm having a lot of withdrawal symptoms like sweats, tremors, and anxiety. I've relieved that I am here for help. Like the vicious cycle has been broken." Patient has been resting in bed due to feeling weak. He has gatorade by his bed and agrees to increase his fluid intake  During hospitalization:  Medications managed--his home medications were adjusted, gabapentin 300 mg TID and 100 mg at bedtime changed to 300 mg QID for anxiety.  Buspar 15 mg TID for anxiety, Vistaril 50 mg every 6 hours PRN anxiety, Robaxin 500 mg TID for back pain, lopressor 50 mg BID for HTN ordered by hospitalist for HTN, Zoloft 100 mg depression daily, and Ambien 10 mg for insomnia at bedtime.  Patient's blood pressure and pulse remained elevated without control and was sent to the hospital for stabilization.  He returned yesterday with stable blood pressures and pulses.  He attended and participated in therapy.  Patient denied suicidal/homicidal ideations and auditory/visual hallucinations, follow-up appointments encouraged to attend, outside support groups encouraged and information given, Rx given.  Khizar plan to seek help with his grieving at Hospice, attend AA, and use his support system to prevent alcohol relapse.  He is mentally and  physically stable for discharge.  Consults:  None  Significant Diagnostic Studies:  labs: Completed, reviewed, stable  Discharge Vitals:   Blood pressure 143/96, pulse 126, temperature 98.6 F (37 C), temperature source Oral, resp. rate 20, height 6\' 1"  (1.854 m), weight 113.399 kg (250 lb). Body mass index is 32.99 kg/(m^2). Lab Results:   Results for orders placed  during the hospital encounter of 05/03/13 (from the past 72 hour(s))  BASIC METABOLIC PANEL     Status: None   Collection Time    05/03/13  5:24 PM      Result Value Range   Sodium 138  135 - 145 mEq/L   Potassium 3.8  3.5 - 5.1 mEq/L   Chloride 103  96 - 112 mEq/L   CO2 26  19 - 32 mEq/L   Glucose, Bld 88  70 - 99 mg/dL   BUN 7  6 - 23 mg/dL   Creatinine, Ser 1.61  0.50 - 1.35 mg/dL   Calcium 9.0  8.4 - 09.6 mg/dL   GFR calc non Af Amer >90  >90 mL/min   GFR calc Af Amer >90  >90 mL/min   Comment: (NOTE)     The eGFR has been calculated using the CKD EPI equation.     This calculation has not been validated in all clinical situations.     eGFR's persistently <90 mL/min signify possible Chronic Kidney     Disease.  CBC     Status: None   Collection Time    05/03/13  5:24 PM      Result Value Range   WBC 8.7  4.0 - 10.5 K/uL   RBC 4.79  4.22 - 5.81 MIL/uL   Hemoglobin 15.8  13.0 - 17.0 g/dL   HCT 04.5  40.9 - 81.1 %   MCV 93.7  78.0 - 100.0 fL   MCH 33.0  26.0 - 34.0 pg   MCHC 35.2  30.0 - 36.0 g/dL   RDW 91.4  78.2 - 95.6 %   Platelets 201  150 - 400 K/uL  TSH     Status: None   Collection Time    05/03/13  5:24 PM      Result Value Range   TSH 1.994  0.350 - 4.500 uIU/mL   Comment: Performed at [facility]  TROPONIN I     Status: None   Collection Time    05/03/13  5:24 PM      Result Value Range   Troponin I <0.30  <0.30 ng/mL   Comment:            Due to the release kinetics of cTnI,     a negative result within the first hours     of the onset of symptoms does not rule out     myocardial infarction with certainty.     If myocardial infarction is still suspected,     repeat the test at appropriate intervals.  D-DIMER, QUANTITATIVE     Status: Abnormal   Collection Time    05/03/13  5:24 PM      Result Value Range   D-Dimer, Quant 0.97 (*) 0.00 - 0.48 ug/mL-FEU   Comment:            AT THE INHOUSE ESTABLISHED CUTOFF     VALUE OF 0.48 ug/mL FEU,      THIS ASSAY HAS BEEN DOCUMENTED     IN THE LITERATURE TO HAVE     A SENSITIVITY AND  NEGATIVE     PREDICTIVE VALUE OF AT LEAST     98 TO 99%.  THE TEST RESULT     SHOULD BE CORRELATED WITH     AN ASSESSMENT OF THE CLINICAL     PROBABILITY OF DVT / VTE.  MRSA PCR SCREENING     Status: None   Collection Time    05/03/13  6:01 PM      Result Value Range   MRSA by PCR NEGATIVE  NEGATIVE   Comment:            The GeneXpert MRSA Assay (FDA     approved for NASAL specimens     only), is one component of a     comprehensive MRSA colonization     surveillance program. It is not     intended to diagnose MRSA     infection nor to guide or     monitor treatment for     MRSA infections.  TROPONIN I     Status: None   Collection Time    05/03/13 11:12 PM      Result Value Range   Troponin I <0.30  <0.30 ng/mL   Comment:            Due to the release kinetics of cTnI,     a negative result within the first hours     of the onset of symptoms does not rule out     myocardial infarction with certainty.     If myocardial infarction is still suspected,     repeat the test at appropriate intervals.  TROPONIN I     Status: None   Collection Time    05/04/13  5:30 AM      Result Value Range   Troponin I <0.30  <0.30 ng/mL   Comment:            Due to the release kinetics of cTnI,     a negative result within the first hours     of the onset of symptoms does not rule out     myocardial infarction with certainty.     If myocardial infarction is still suspected,     repeat the test at appropriate intervals.    Physical Findings: AIMS: Facial and Oral Movements Muscles of Facial Expression: None, normal Lips and Perioral Area: None, normal Jaw: None, normal Tongue: None, normal,Extremity Movements Upper (arms, wrists, hands, fingers): None, normal Lower (legs, knees, ankles, toes): None, normal, Trunk Movements Neck, shoulders, hips: None, normal, Overall Severity Severity of abnormal  movements (highest score from questions above): None, normal Incapacitation due to abnormal movements: None, normal Patient's awareness of abnormal movements (rate only patient's report): No Awareness, Dental Status Current problems with teeth and/or dentures?: No Does patient usually wear dentures?: Yes  CIWA:    COWS:     Psychiatric Specialty Exam: See Psychiatric Specialty Exam and Suicide Risk Assessment completed by Attending Physician prior to discharge.  Discharge destination:  Home  Is patient on multiple antipsychotic therapies at discharge:  No   Has Patient had three or more failed trials of antipsychotic monotherapy by history:  No  Recommended Plan for Multiple Antipsychotic Therapies: NA  Discharge Orders   Future Orders Complete By Expires   Activity as tolerated - No restrictions  As directed    Diet - low sodium heart healthy  As directed        Medication List    STOP taking these medications  hydrOXYzine 50 MG capsule  Commonly known as:  VISTARIL      TAKE these medications     Indication   busPIRone 15 MG tablet  Commonly known as:  BUSPAR  Take 1 tablet (15 mg total) by mouth 3 (three) times daily.   Indication:  Anxiety Disorder     gabapentin 300 MG capsule  Commonly known as:  NEURONTIN  Take 1 capsule (300 mg total) by mouth 4 (four) times daily.   Indication:  Alcohol Withdrawal Syndrome, Neuropathic Pain     hydrOXYzine 50 MG tablet  Commonly known as:  ATARAX/VISTARIL  Take 1 tablet (50 mg total) by mouth every 6 (six) hours as needed for anxiety.   Indication:  Anxiety Neurosis     methocarbamol 500 MG tablet  Commonly known as:  ROBAXIN  Take 1 tablet (500 mg total) by mouth every 8 (eight) hours.   Indication:  Musculoskeletal Pain     metoprolol 50 MG tablet  Commonly known as:  LOPRESSOR  Take 1 tablet (50 mg total) by mouth 2 (two) times daily.   Indication:  Feeling Anxious, tachycardia     sertraline 100 MG tablet   Commonly known as:  ZOLOFT  Take 1 tablet (100 mg total) by mouth daily. For anxiety and depression.   Indication:  Anxiety Disorder, Major Depressive Disorder     zolpidem 10 MG tablet  Commonly known as:  AMBIEN  Take 1 tablet (10 mg total) by mouth at bedtime as needed for sleep.   Indication:  Trouble Sleeping           Follow-up Information   Follow up with [facility]-Dr. Maretta Bees.   Contact information:   [address] [place], [place] [address] Phone: [phone] Fax: [phone]      Follow-up recommendations:  Activity:  as tolerated Diet:  low-sodium heart healthy diet  Comments:  Patient will continue his care at [facility] and Hospice for bereavement care.  Total Discharge Time:  Greater than 30 minutes.  SignedNanine Means, PMH-NP 05/05/2013, 10:47 AM  Patient is seen personally for psych assessment, suicidal risk assessment and discussed with physician extenders. Developed discharge treatment plans and reviewed the information documented and agree with the treatment plan.  Spencer Cardinal,JANARDHAHA R. 05/06/2013 1:10 PM

## 2013-05-05 NOTE — Progress Notes (Signed)
D:Pt denies depression and reports mild anxiety. Pt did not sleep well last night up several times. Pt reports dreaming about his wife that passed away recently. Pt reports pain this morning with arthritis.  A:Offered support, encouragement and 15 minute checks. Gave prn medication for anxiety and scheduled pain medication. R:Pt denies si and hi. Safety maintained on the unit.

## 2013-05-05 NOTE — Progress Notes (Signed)
[facility] Adult Case Management Discharge Plan :  Will you be returning to the same living situation after discharge: Yes,  home At discharge, do you have transportation home?:Yes,  friend Do you have the ability to pay for your medications:Yes,  BCBS  Release of information consent forms completed and in the chart;  Patient's signature needed at discharge.  Patient to Follow up at: Follow-up Information   Follow up with [facility]-Dr. Maretta Bees On 05/06/2013. (Walk in between 8am-11am for hospital followup/medication management. )    Contact information:   [address] [place], [place] [address] Phone: [phone] Fax: [phone]      Patient denies SI/HI:   Yes,  during admission, group, and self report.    Safety Planning and Suicide Prevention discussed:  Yes,  SPE not required for this pt. SPI pamphlet provided to pt and he was encouraged to ask questions, talk about concerns, and share information with support network.   Smart, Haseeb Fiallos 05/05/2013, 11:21 AM

## 2013-05-06 NOTE — H&P (Signed)
Psychiatric Admission Assessment Adult  Patient Identification:  Ronald Robertson Date of Evaluation:  05/06/2013 Chief Complaint:  alcohol dependence and opiates History of Present Illness:: 35 Y/ male who was D/C from [facility] where he had been treated for Opiod and Alcohol Dependence as well as Major Depression and Bereavement, and admitted to [facility] after he developed episodes of sinu tachycardia. His HR was registered up to 157. He was evaluated, placed on beta blockers and referred back to [facility] for completion of treatment Elements:  Location:  in patient. Quality:  unable to function. Severity:  severe. Timing:  every day since his wife died. Duration:  a month. Context:  Active abuse of alcohol and opioids out of control since his wife died, who later developed tachycardai. Associated Signs/Synptoms: Depression Symptoms:  depressed mood, anxiety, disturbed sleep, (Hypo) Manic Symptoms:  Denies Anxiety Symptoms:  Excessive Worry, Psychotic Symptoms:  Denies PTSD Symptoms: Negative  Psychiatric Specialty Exam: Physical Exam  Review of Systems  Constitutional: Negative.   HENT: Negative.   Eyes: Negative.   Respiratory: Negative.   Cardiovascular: Negative.   Gastrointestinal: Negative.   Genitourinary: Negative.   Musculoskeletal: Negative.   Skin: Negative.   Neurological: Negative.   Endo/Heme/Allergies: Negative.   Psychiatric/Behavioral: Positive for substance abuse. The patient is nervous/anxious.     Blood pressure 140/70, pulse 80, temperature 98.3 F (36.8 C), temperature source Oral, resp. rate 17, height 6\' 1"  (1.854 m), weight 113.399 kg (250 lb).Body mass index is 32.99 kg/(m^2).  General Appearance: Fairly Groomed  Patent attorney::  Fair  Speech:  Clear and Coherent  Volume:  Decreased  Mood:  Anxious and worried  Affect:  Appropriate  Thought Process:  Coherent and Goal Directed  Orientation:  Full (Time, Place, and Person)  Thought Content:  worries, concerns   Suicidal Thoughts:  No  Homicidal Thoughts:  No  Memory:  Immediate;   Fair Recent;   Fair Remote;   Fair  Judgement:  Fair  Insight:  Present  Psychomotor Activity:  Restlessness  Concentration:  Fair  Recall:  Fair  Akathisia:  No  Handed:    AIMS (if indicated):     Assets:  Desire for Improvement Social Support  Sleep:  Number of Hours: 4    Past Psychiatric History: Diagnosis:  Hospitalizations:  Outpatient Care:  Substance Abuse Care:  Self-Mutilation:  Suicidal Attempts:  Violent Behaviors:   Past Medical History:   Past Medical History  Diagnosis Date  . Hypertension   . Depression   . Anxiety    Sinus tachycardia Allergies:  No Known Allergies PTA Medications: No prescriptions prior to admission    Previous Psychotropic Medications:  Medication/Dose  Zoloft, Buspar, Ambien, Neurontin, Vistaril               Substance Abuse History in the last 12 months:  yes  Consequences of Substance Abuse: Negative  Social History:  reports that he has been smoking.  He does not have any smokeless tobacco history on file. He reports that he drinks about 126.0 ounces of alcohol per week. He reports that he uses illicit drugs (Marijuana). Additional Social History:                      Current Place of Residence:   Place of Birth:   Family Members: Marital Status:  Widowed Children:  Sons:  Daughters: Relationships: Education:  Industrial/product designer: Religious Beliefs/Practices: History of Abuse (Emotional/Phsycial/Sexual) Teacher, music History:  None.  Legal History: Hobbies/Interests:  Family History:   Family History  Problem Relation Age of Onset  . Adopted: Yes    Results for orders placed during the hospital encounter of 05/03/13 (from the past 72 hour(s))  BASIC METABOLIC PANEL     Status: None   Collection Time    05/03/13  5:24 PM      Result Value Range   Sodium 138  135 - 145  mEq/L   Potassium 3.8  3.5 - 5.1 mEq/L   Chloride 103  96 - 112 mEq/L   CO2 26  19 - 32 mEq/L   Glucose, Bld 88  70 - 99 mg/dL   BUN 7  6 - 23 mg/dL   Creatinine, Ser 1.61  0.50 - 1.35 mg/dL   Calcium 9.0  8.4 - 09.6 mg/dL   GFR calc non Af Amer >90  >90 mL/min   GFR calc Af Amer >90  >90 mL/min   Comment: (NOTE)     The eGFR has been calculated using the CKD EPI equation.     This calculation has not been validated in all clinical situations.     eGFR's persistently <90 mL/min signify possible Chronic Kidney     Disease.  CBC     Status: None   Collection Time    05/03/13  5:24 PM      Result Value Range   WBC 8.7  4.0 - 10.5 K/uL   RBC 4.79  4.22 - 5.81 MIL/uL   Hemoglobin 15.8  13.0 - 17.0 g/dL   HCT 04.5  40.9 - 81.1 %   MCV 93.7  78.0 - 100.0 fL   MCH 33.0  26.0 - 34.0 pg   MCHC 35.2  30.0 - 36.0 g/dL   RDW 91.4  78.2 - 95.6 %   Platelets 201  150 - 400 K/uL  TSH     Status: None   Collection Time    05/03/13  5:24 PM      Result Value Range   TSH 1.994  0.350 - 4.500 uIU/mL   Comment: Performed at [facility]  TROPONIN I     Status: None   Collection Time    05/03/13  5:24 PM      Result Value Range   Troponin I <0.30  <0.30 ng/mL   Comment:            Due to the release kinetics of cTnI,     a negative result within the first hours     of the onset of symptoms does not rule out     myocardial infarction with certainty.     If myocardial infarction is still suspected,     repeat the test at appropriate intervals.  D-DIMER, QUANTITATIVE     Status: Abnormal   Collection Time    05/03/13  5:24 PM      Result Value Range   D-Dimer, Quant 0.97 (*) 0.00 - 0.48 ug/mL-FEU   Comment:            AT THE INHOUSE ESTABLISHED CUTOFF     VALUE OF 0.48 ug/mL FEU,     THIS ASSAY HAS BEEN DOCUMENTED     IN THE LITERATURE TO HAVE     A SENSITIVITY AND NEGATIVE     PREDICTIVE VALUE OF AT LEAST     98 TO 99%.  THE TEST RESULT     SHOULD BE CORRELATED WITH     AN  ASSESSMENT  OF THE CLINICAL     PROBABILITY OF DVT / VTE.  MRSA PCR SCREENING     Status: None   Collection Time    05/03/13  6:01 PM      Result Value Range   MRSA by PCR NEGATIVE  NEGATIVE   Comment:            The GeneXpert MRSA Assay (FDA     approved for NASAL specimens     only), is one component of a     comprehensive MRSA colonization     surveillance program. It is not     intended to diagnose MRSA     infection nor to guide or     monitor treatment for     MRSA infections.  TROPONIN I     Status: None   Collection Time    05/03/13 11:12 PM      Result Value Range   Troponin I <0.30  <0.30 ng/mL   Comment:            Due to the release kinetics of cTnI,     a negative result within the first hours     of the onset of symptoms does not rule out     myocardial infarction with certainty.     If myocardial infarction is still suspected,     repeat the test at appropriate intervals.  TROPONIN I     Status: None   Collection Time    05/04/13  5:30 AM      Result Value Range   Troponin I <0.30  <0.30 ng/mL   Comment:            Due to the release kinetics of cTnI,     a negative result within the first hours     of the onset of symptoms does not rule out     myocardial infarction with certainty.     If myocardial infarction is still suspected,     repeat the test at appropriate intervals.   Psychological Evaluations:  Assessment:   DSM5:  Schizophrenia Disorders:   Obsessive-Compulsive Disorders:   Trauma-Stressor Disorders:   Substance/Addictive Disorders:  Alcohol Related Disorder - Severe (303.90) and Opioid Disorder - Severe (304.00) Depressive Disorders:  Major Depressive Disorder - Moderate (296.22)  AXIS I:  Anxiety Disorder NOS AXIS II:  Deferred AXIS III:   Past Medical History  Diagnosis Date  . Hypertension   . Depression   . Anxiety    AXIS IV:  other psychosocial or environmental problems AXIS V:  51-60 moderate symptoms  Treatment  Plan/Recommendations:  Supportive approach/coping skills/ relapse prevention                                                                 CBT/mindfulness/grief work                                                                 Continue meds, add the Lopressor 50 mg BID  Treatment Plan Summary: Daily contact with patient  to assess and evaluate symptoms and progress in treatment Medication management Current Medications:  No current facility-administered medications for this encounter.   Current Outpatient Prescriptions  Medication Sig Dispense Refill  . busPIRone (BUSPAR) 15 MG tablet Take 1 tablet (15 mg total) by mouth 3 (three) times daily.  90 tablet  0  . gabapentin (NEURONTIN) 300 MG capsule Take 1 capsule (300 mg total) by mouth 4 (four) times daily.  120 capsule  0  . hydrOXYzine (ATARAX/VISTARIL) 50 MG tablet Take 1 tablet (50 mg total) by mouth every 6 (six) hours as needed for anxiety.  60 tablet  0  . methocarbamol (ROBAXIN) 500 MG tablet Take 1 tablet (500 mg total) by mouth every 8 (eight) hours.  90 tablet  0  . metoprolol (LOPRESSOR) 50 MG tablet Take 1 tablet (50 mg total) by mouth 2 (two) times daily.  60 tablet  0  . sertraline (ZOLOFT) 100 MG tablet Take 1 tablet (100 mg total) by mouth daily. For anxiety and depression.  30 tablet  0  . zolpidem (AMBIEN) 10 MG tablet Take 1 tablet (10 mg total) by mouth at bedtime as needed for sleep.  7 tablet  0    Observation Level/Precautions:  15 minute checks  Laboratory:  As per [facility]  Psychotherapy:  Individual/group  Medications:  As above  Consultations:    Discharge Concerns:    Estimated LOS: 2-3 days  Other:     I certify that inpatient services furnished can reasonably be expected to improve the patient's condition.   Imaad Reuss A 10/21/20143:24 PM

## 2013-05-07 NOTE — Progress Notes (Signed)
Patient Discharge Instructions:  After Visit Summary (AVS):   Faxed to:  05/07/13 Discharge Summary Note:   Faxed to:  05/07/13 Psychiatric Admission Assessment Note:   Faxed to:  05/07/13 Suicide Risk Assessment - Discharge Assessment:   Faxed to:  05/07/13 Faxed/Sent to the Next Level Care provider:  05/07/13 Faxed to [facility] Dr. Maretta Bees @ [phone]  Jerelene Redden, 05/07/2013, 3:58 PM

## 2013-05-08 NOTE — Clinical Social Work Note (Signed)
CSW contacted [facility] to get verbal consent to release records to [facility] for second admission date 10/19-d/c. Witness: Chelsea Horton LCSW. Release turned in to Medical Records.

## 2013-05-09 NOTE — Progress Notes (Signed)
Patient Discharge Instructions:  After Visit Summary (AVS):   Faxed to:  05/09/13 Discharge Summary Note:   Faxed to:  05/09/13 Psychiatric Admission Assessment Note:   Faxed to:  05/09/13 Suicide Risk Assessment - Discharge Assessment:   Faxed to:  05/09/13 Faxed/Sent to the Next Level Care provider:  05/09/13 Faxed to [facility] @ [phone]  Jerelene Redden, 05/09/2013, 12:09 PM

## 2013-05-16 ENCOUNTER — Emergency Department (HOSPITAL_COMMUNITY)
Admission: EM | Admit: 2013-05-16 | Discharge: 2013-05-18 | Disposition: A | Discharge status: Home / Self Care | Payer: BC Managed Care – PPO | Attending: Emergency Medicine | Admitting: Emergency Medicine

## 2013-05-16 ENCOUNTER — Encounter (HOSPITAL_COMMUNITY): Payer: Self-pay | Admitting: Emergency Medicine

## 2013-05-16 DIAGNOSIS — R45851 Suicidal ideations: Secondary | ICD-10-CM | POA: Insufficient documentation

## 2013-05-16 DIAGNOSIS — F1020 Alcohol dependence, uncomplicated: Secondary | ICD-10-CM | POA: Insufficient documentation

## 2013-05-16 DIAGNOSIS — I10 Essential (primary) hypertension: Secondary | ICD-10-CM | POA: Insufficient documentation

## 2013-05-16 DIAGNOSIS — F329 Major depressive disorder, single episode, unspecified: Secondary | ICD-10-CM

## 2013-05-16 DIAGNOSIS — R109 Unspecified abdominal pain: Secondary | ICD-10-CM | POA: Insufficient documentation

## 2013-05-16 DIAGNOSIS — F1010 Alcohol abuse, uncomplicated: Secondary | ICD-10-CM

## 2013-05-16 DIAGNOSIS — F3289 Other specified depressive episodes: Secondary | ICD-10-CM | POA: Insufficient documentation

## 2013-05-16 DIAGNOSIS — Z79899 Other long term (current) drug therapy: Secondary | ICD-10-CM | POA: Insufficient documentation

## 2013-05-16 DIAGNOSIS — F17200 Nicotine dependence, unspecified, uncomplicated: Secondary | ICD-10-CM | POA: Insufficient documentation

## 2013-05-16 DIAGNOSIS — F411 Generalized anxiety disorder: Secondary | ICD-10-CM | POA: Insufficient documentation

## 2013-05-16 LAB — COMPREHENSIVE METABOLIC PANEL
ALT: 59 U/L — ABNORMAL HIGH (ref 0–53)
AST: 36 U/L (ref 0–37)
Albumin: 3.6 g/dL (ref 3.5–5.2)
BUN: 5 mg/dL — ABNORMAL LOW (ref 6–23)
Calcium: 9.9 mg/dL (ref 8.4–10.5)
Creatinine, Ser: 0.7 mg/dL (ref 0.50–1.35)
Total Protein: 7.7 g/dL (ref 6.0–8.3)

## 2013-05-16 LAB — CBC WITH DIFFERENTIAL/PLATELET
Basophils Absolute: 0 10*3/uL (ref 0.0–0.1)
Basophils Relative: 0 % (ref 0–1)
Eosinophils Absolute: 0.2 10*3/uL (ref 0.0–0.7)
Eosinophils Relative: 1 % (ref 0–5)
HCT: 49.8 % (ref 39.0–52.0)
Hemoglobin: 18.1 g/dL — ABNORMAL HIGH (ref 13.0–17.0)
Lymphocytes Relative: 17 % (ref 12–46)
MCH: 32.8 pg (ref 26.0–34.0)
MCHC: 36.3 g/dL — ABNORMAL HIGH (ref 30.0–36.0)
MCV: 90.4 fL (ref 78.0–100.0)
Monocytes Absolute: 0.9 10*3/uL (ref 0.1–1.0)
Monocytes Relative: 6 % (ref 3–12)
RDW: 14 % (ref 11.5–15.5)
WBC: 15.9 10*3/uL — ABNORMAL HIGH (ref 4.0–10.5)

## 2013-05-16 LAB — RAPID URINE DRUG SCREEN, HOSP PERFORMED
Amphetamines: NOT DETECTED
Barbiturates: NOT DETECTED
Benzodiazepines: NOT DETECTED
Cocaine: NOT DETECTED
Opiates: NOT DETECTED
Tetrahydrocannabinol: POSITIVE — AB

## 2013-05-16 MED ORDER — GABAPENTIN 300 MG PO CAPS
300.0000 mg | ORAL_CAPSULE | Freq: Four times a day (QID) | ORAL | Status: DC
  Administered 2013-05-17 – 2013-05-18 (×6): 300 mg via ORAL
  Filled 2013-05-16 (×11): qty 1

## 2013-05-16 MED ORDER — TRAZODONE HCL 50 MG PO TABS
50.0000 mg | ORAL_TABLET | Freq: Every day | ORAL | Status: DC
  Administered 2013-05-16 – 2013-05-17 (×2): 50 mg via ORAL
  Filled 2013-05-16 (×2): qty 1

## 2013-05-16 MED ORDER — BUSPIRONE HCL 10 MG PO TABS
15.0000 mg | ORAL_TABLET | Freq: Three times a day (TID) | ORAL | Status: DC
  Administered 2013-05-16 – 2013-05-18 (×5): 15 mg via ORAL
  Filled 2013-05-16 (×5): qty 2

## 2013-05-16 MED ORDER — METOPROLOL TARTRATE 25 MG PO TABS
50.0000 mg | ORAL_TABLET | Freq: Two times a day (BID) | ORAL | Status: DC
  Administered 2013-05-16 – 2013-05-18 (×4): 50 mg via ORAL
  Filled 2013-05-16 (×4): qty 2

## 2013-05-16 MED ORDER — SERTRALINE HCL 50 MG PO TABS
100.0000 mg | ORAL_TABLET | Freq: Every day | ORAL | Status: DC
  Administered 2013-05-16 – 2013-05-18 (×3): 100 mg via ORAL
  Filled 2013-05-16 (×3): qty 2

## 2013-05-16 MED ORDER — HYDROXYZINE HCL 25 MG PO TABS
50.0000 mg | ORAL_TABLET | Freq: Four times a day (QID) | ORAL | Status: DC | PRN
  Administered 2013-05-16 – 2013-05-18 (×6): 50 mg via ORAL
  Filled 2013-05-16 (×6): qty 2

## 2013-05-16 MED ORDER — NICOTINE 21 MG/24HR TD PT24
21.0000 mg | MEDICATED_PATCH | Freq: Every day | TRANSDERMAL | Status: DC
  Administered 2013-05-16 – 2013-05-18 (×3): 21 mg via TRANSDERMAL
  Filled 2013-05-16 (×3): qty 1

## 2013-05-16 NOTE — BH Assessment (Signed)
Assessment Note  Ronald Robertson is a 52 y.o. male who presents to [facility], requesting detox from alcohol and oxycodone.  Pt came to the emerg dept due to worsening depression and SA.  Pt spouse died 6 weeks ago from liver cancer.  Pt denies SI/HI/AVH.  During the interview, pt is tearful, anxious and c/o w/d sxs: tremors and "tingling skin".  Pt states that he has a long hx of SA and was sober for 11 years, and began drinking again while spouse was ill, attempting to cope with her illness.  Pt states since her death he has been heavily using alcohol and oxycodone(he is prescribed oxycodone from years of heavy lifting with his job).   Pt states that when is completed he will be transferring to [facility] in [place] for 2 yr rehab program.  Pt says he feels hopeless and helpless and is scheduled to begin grief counseling with Hospice on 05/19/13, however felt he needed to address SA issues.  Pt reports drinking 1 case, daily of beer, last drink was 05/16/13, pt consumed 12 beers.  Pt admits he takes 15 oxycodone pills, daily, last use was 05/14/13, the amount taken is unk.  Pt has admissions with [facility](6578), [facility](2003) and [facility] [id number]).     Axis I: Alcohol Dependence; Opiate Dependence Axis II: Deferred Axis III:  Past Medical History  Diagnosis Date  . Hypertension   . Depression   . Anxiety    Axis IV: other psychosocial or environmental problems, problems related to social environment and problems with primary support group Axis V: 41-50 serious symptoms  Past Medical History:  Past Medical History  Diagnosis Date  . Hypertension   . Depression   . Anxiety     Past Surgical History  Procedure Laterality Date  . Abdominal surgery      ulcer repair  . Gi ulcer rupture    . Shoulder fusion surgery Left   . Tonsillectomy      Family History:  Family History  Problem Relation Age of Onset  . Adopted: Yes    Social History:  reports that he has been smoking.  He  does not have any smokeless tobacco history on file. He reports that he drinks about 126.0 ounces of alcohol per week. He reports that he uses illicit drugs (Marijuana).  Additional Social History:  Alcohol / Drug Use Pain Medications: See MAR  Prescriptions: See MAR  Over the Counter: See MAR  History of alcohol / drug use?: Yes Longest period of sobriety (when/how long): 11 yrs sobriety  Negative Consequences of Use: Financial Withdrawal Symptoms: Tingling;Tremors;Other (Comment) (Anxiety ) Substance #3 Name of Substance 3: Alcohol  3 - Age of First Use: 15 YOM  3 - Amount (size/oz): 1 Case  3 - Frequency: Daily  3 - Duration: On-going  3 - Last Use / Amount: 05/16/13 Substance #4 Name of Substance 4: Oxycodone  4 - Age of First Use: 40's  4 - Amount (size/oz): 15 pills  4 - Frequency: Daily  4 - Duration: 1 Month  4 - Last Use / Amount: 05/14/13  CIWA: CIWA-Ar BP: 153/92 mmHg Pulse Rate: 67 COWS:    Allergies: No Known Allergies  Home Medications:  (Not in a hospital admission)  OB/GYN Status:  No LMP for male patient.  General Assessment Data Location of Assessment: [facility] Is this a Tele or Face-to-Face Assessment?: Tele Assessment Is this an Initial Assessment or a Re-assessment for this encounter?: Initial Assessment Living  Arrangements: Alone Can pt return to current living arrangement?: Yes Admission Status: Voluntary Is patient capable of signing voluntary admission?: Yes Transfer from: Acute Hospital Referral Source: MD  Medical Screening Exam [facility] Walk-in ONLY) Medical Exam completed: No Reason for MSE not completed: Other: (None )  [facility] Crisis Care Plan Living Arrangements: Alone Name of Psychiatrist: None  Name of Therapist: None   Education Status Is patient currently in school?: No Current Grade: None  Highest grade of school patient has completed: 12th Name of school: None  Contact person: None   Risk to self Suicidal Ideation:  No Suicidal Intent: No Is patient at risk for suicide?: No Suicidal Plan?: No Access to Means: No What has been your use of drugs/alcohol within the last 12 months?: Abusing: alcohol; oxycodone  Previous Attempts/Gestures: No How many times?: 0 Other Self Harm Risks: None  Triggers for Past Attempts: None known Intentional Self Injurious Behavior: None Family Suicide History: No Recent stressful life event(s): Other (Comment);Loss (Comment) (Spouse died 6 weeks ago; Chronic SA) Persecutory voices/beliefs?: No Depression: Yes Depression Symptoms: Insomnia;Tearfulness;Despondent;Fatigue;Loss of interest in usual pleasures;Feeling worthless/self pity;Isolating Substance abuse history and/or treatment for substance abuse?: Yes Suicide prevention information given to non-admitted patients: Not applicable  Risk to Others Homicidal Ideation: No Thoughts of Harm to Others: No Current Homicidal Intent: No Current Homicidal Plan: No Access to Homicidal Means: No Identified Victim: None  History of harm to others?: No Assessment of Violence: None Noted Violent Behavior Description: None  Does patient have access to weapons?: No Criminal Charges Pending?: No Does patient have a court date: No  Psychosis Hallucinations: None noted Delusions: None noted  Mental Status Report Appear/Hygiene: Disheveled Eye Contact: Poor Motor Activity: Unremarkable Speech: Logical/coherent Level of Consciousness: Alert Mood: Depressed;Anxious;Helpless;Sad;Other (Comment) (Hopeless) Affect: Anxious;Depressed;Sad Anxiety Level: Moderate Thought Processes: Coherent;Relevant Judgement: Unimpaired Orientation: Person;Place;Time;Situation Obsessive Compulsive Thoughts/Behaviors: None  Cognitive Functioning Concentration: Decreased Memory: Recent Intact;Remote Intact IQ: Average Insight: Fair Impulse Control: Fair Appetite: Poor Weight Loss:  (No food intake x7days ) Weight Gain: 0 Sleep:  Decreased Total Hours of Sleep: 4 Vegetative Symptoms: None  ADLScreening [facility] Assessment Services) Patient's cognitive ability adequate to safely complete daily activities?: Yes Patient able to express need for assistance with ADLs?: Yes Independently performs ADLs?: Yes (appropriate for developmental age)  Prior Inpatient Therapy Prior Inpatient Therapy: Yes Prior Therapy Dates: 2014,2003,1986 Prior Therapy Facilty/Provider(s): [facility]; [facility]; [facility]  Reason for Treatment: Detox/Rehab/Depression   Prior Outpatient Therapy Prior Outpatient Therapy: No Prior Therapy Dates: None  Prior Therapy Facilty/Provider(s): None  Reason for Treatment: None   ADL Screening (condition at time of admission) Patient's cognitive ability adequate to safely complete daily activities?: Yes Is the patient deaf or have difficulty hearing?: No Does the patient have difficulty seeing, even when wearing glasses/contacts?: No Does the patient have difficulty concentrating, remembering, or making decisions?: No Patient able to express need for assistance with ADLs?: Yes Does the patient have difficulty dressing or bathing?: No Independently performs ADLs?: Yes (appropriate for developmental age) Does the patient have difficulty walking or climbing stairs?: No Weakness of Legs: None Weakness of Arms/Hands: None  Home Assistive Devices/Equipment Home Assistive Devices/Equipment: None  Therapy Consults (therapy consults require a physician order) PT Evaluation Needed: No OT Evalulation Needed: No SLP Evaluation Needed: No Abuse/Neglect Assessment (Assessment to be complete while patient is alone) Physical Abuse: Denies Verbal Abuse: Denies Sexual Abuse: Denies Exploitation of patient/patient's resources: Denies Self-Neglect: Denies Values / Beliefs Cultural Requests During Hospitalization:  None Spiritual Requests During Hospitalization: None Consults Spiritual Care Consult  Needed: No Social Work Consult Needed: No Merchant navy officer (For Healthcare) Advance Directive: Patient does not have advance directive;Patient would not like information Pre-existing out of facility DNR order (yellow form or pink MOST form): No Nutrition Screen- [facility] Patient's home diet: Regular  Additional Information 1:1 In Past 12 Months?: No CIRT Risk: No Elopement Risk: No Does patient have medical clearance?: Yes     Disposition:  Disposition Initial Assessment Completed for this Encounter: Yes Disposition of Patient: Inpatient treatment program;Referred to [facility] ) Type of inpatient treatment program: Adult Patient referred to: Other (Comment) (Referred to [facility])  On Site Evaluation by:   Reviewed with Physician:    Murrell Redden 05/16/2013 8:38 PM

## 2013-05-16 NOTE — ED Notes (Addendum)
Pt is awake and alert, denies HI or SI. Interacts will with staff and others. Pt reports recent loss of spouse (6 weeks ago) and unable to cope with benign alone. Patient is tearful and flat affect. will continue to monitor for safety.

## 2013-05-16 NOTE — ED Provider Notes (Signed)
Medical screening examination/treatment/procedure(s) were performed by non-physician practitioner and as supervising physician I was immediately available for consultation/collaboration.  EKG Interpretation   None         Zienna Ahlin N Amoria Mclees, DO 05/16/13 2255 

## 2013-05-16 NOTE — Progress Notes (Signed)
Patient confirms his pcp is DR. Kim Millsaps.  System updated.

## 2013-05-16 NOTE — BH Assessment (Signed)
[facility] Assessment Progress Note  At 21:50 [facility] called.  Pt declined by Dr Elvera Lennox. [place] for lack of criteria.  Doylene Canning, MA Triage Specialist 05/16/13 @ 21:58

## 2013-05-16 NOTE — Progress Notes (Signed)
   CARE MANAGEMENT ED NOTE 05/16/2013  Patient:  Ronald Robertson, Ronald Robertson   Account Number:  [id number]  Date Initiated:  05/16/2013  Documentation initiated by:  Edd Arbour  Subjective/Objective Assessment:   52 yr old male bcbs [place] ppo pt 52 y.o. male requesting detox help and complaining of depression, worsened after the passing of his wife 6 weeks ago (liver cancer) addicted to opiates used for pain     Subjective/Objective Assessment Detail:     Action/Plan:   CM spoke with Tom at [facility]TS [phone] to inform of TSS consult for the pt Reports pt will possibly be telepsyched for assessment ED PA updated of TTS order   Action/Plan Detail:   Anticipated DC Date:       Status Recommendation to Physician:   Result of Recommendation:    Other ED Services  Consult Working Plan    DC [employer]  Other    Choice offered to / List presented to:            Status of service:  Completed, signed off  ED Comments:   ED Comments Detail:

## 2013-05-16 NOTE — Consult Note (Signed)
Pt D/C from [facility] 10/19 for same complaints-was to go to [facility] .Assessed by [facility] and referred to [facility] but not accepted.Asked to review his chart-he has no SI/HI per ED provider note.He does not require Detox a little over 1 week out from detox.Recommend placement at treatment facility such as [facility] etc as pt is insured.

## 2013-05-16 NOTE — ED Provider Notes (Signed)
CSN: [id number]     Arrival date & time 05/16/13  1617 History  This chart was scribed for non-physician practitioner Fayrene Helper, PA-C working with Layla Maw Ward, DO by Caryn Bee, ED Scribe. This patient was seen in room [facility]TR2/[facility] and the patient's care was started at 4:35 PM.    Chief Complaint  Patient presents with  . Medical Clearance   HPI HPI Comments: Ronald Robertson is a 52 y.o. male who presents to the Emergency Department requesting detox help and complaining of depression, worsened after the passing of his wife 6 weeks ago (liver cancer). Pt has addiction to opiate medications prescribed for chronic pain. Pt also abuses alcohol. He drinks about 1 case of beer daily. The last time he drank was around 2:00 PM today (about 12 beers). H is a smoker, about 1 pack per day. Pt reports abdominal pain. He has not eaten in 7 days. Pt has tried to get help for his alcohol and drug addictions before. He denies use of recreational drugs. Pt denies HI or SI. Pt has h/o perforated ulcer.    Past Medical History  Diagnosis Date  . Hypertension   . Depression   . Anxiety    Past Surgical History  Procedure Laterality Date  . Abdominal surgery      ulcer repair  . Gi ulcer rupture    . Shoulder fusion surgery Left   . Tonsillectomy     Family History  Problem Relation Age of Onset  . Adopted: Yes   History  Substance Use Topics  . Smoking status: Current Every Day Smoker -- 1.00 packs/day for 35 years  . Smokeless tobacco: Not on file  . Alcohol Use: 126.0 oz/week    210 Cans of beer per week    Review of Systems  Gastrointestinal: Positive for abdominal pain.  Psychiatric/Behavioral: Negative for suicidal ideas and self-injury.    Allergies  Review of patient's allergies indicates no known allergies.  Home Medications   Current Outpatient Rx  Name  Route  Sig  Dispense  Refill  . busPIRone (BUSPAR) 15 MG tablet   Oral   Take 1 tablet (15 mg total) by mouth 3  (three) times daily.   90 tablet   0   . gabapentin (NEURONTIN) 300 MG capsule   Oral   Take 1 capsule (300 mg total) by mouth 4 (four) times daily.   120 capsule   0   . hydrOXYzine (ATARAX/VISTARIL) 50 MG tablet   Oral   Take 1 tablet (50 mg total) by mouth every 6 (six) hours as needed for anxiety.   60 tablet   0   . methocarbamol (ROBAXIN) 500 MG tablet   Oral   Take 1 tablet (500 mg total) by mouth every 8 (eight) hours.   90 tablet   0   . metoprolol (LOPRESSOR) 50 MG tablet   Oral   Take 1 tablet (50 mg total) by mouth 2 (two) times daily.   60 tablet   0   . sertraline (ZOLOFT) 100 MG tablet   Oral   Take 1 tablet (100 mg total) by mouth daily. For anxiety and depression.   30 tablet   0   . zolpidem (AMBIEN) 10 MG tablet   Oral   Take 1 tablet (10 mg total) by mouth at bedtime as needed for sleep.   7 tablet   0    BP 113/78  Pulse 70  Temp(Src) 98.3 F (36.8 C) (  Oral)  Resp 16  SpO2 96%  Physical Exam  Nursing note and vitals reviewed. Constitutional: He is oriented to person, place, and time. He appears well-developed and well-nourished. No distress.  HENT:  Head: Normocephalic and atraumatic.  Eyes: Conjunctivae and EOM are normal. Pupils are equal, round, and reactive to light.  Neck: Neck supple. No tracheal deviation present.  Cardiovascular: Normal rate.   Pulmonary/Chest: Effort normal. No respiratory distress.  Musculoskeletal: Normal range of motion.  Neurological: He is alert and oriented to person, place, and time.  Skin: Skin is warm and dry.  Psychiatric: He has a normal mood and affect. His behavior is normal.    ED Course  Procedures (including critical care time) DIAGNOSTIC STUDIES: Oxygen Saturation is 96% on room air, normal by my interpretation.    COORDINATION OF CARE: 4:39 PM-Discussed treatment plan which includes psychiatric help with pt at bedside and pt agreed to plan.   5:52 PM TTS consulted and will evaluate  pt.  Pt does have elevated WBC of 15, without left shift.  Likely from stress, doubt infection. Otherwise medically cleared.    Labs Review Labs Reviewed  CBC WITH DIFFERENTIAL - Abnormal; Notable for the following:    WBC 15.9 (*)    Hemoglobin 18.1 (*)    MCHC 36.3 (*)    Neutro Abs 12.0 (*)    All other components within normal limits  COMPREHENSIVE METABOLIC PANEL - Abnormal; Notable for the following:    BUN 5 (*)    ALT 59 (*)    All other components within normal limits  URINE RAPID DRUG SCREEN (HOSP PERFORMED) - Abnormal; Notable for the following:    Tetrahydrocannabinol POSITIVE (*)    All other components within normal limits  ETHANOL   Imaging Review No results found.  EKG Interpretation   None       MDM   1. Suicidal ideation   2. Alcohol dependency    BP 153/92  Pulse 67  Temp(Src) 98.3 F (36.8 C) (Oral)  Resp 16  SpO2 98%  I personally performed the services described in this documentation, which was scribed in my presence. The recorded information has been reviewed and is accurate.     Fayrene Helper, PA-C 05/16/13 2010

## 2013-05-16 NOTE — ED Notes (Signed)
Pt has one pt belongings bag and one duffel bag containing clothing and tolietries.

## 2013-05-16 NOTE — ED Notes (Signed)
Spoke to extender about home meds and holding orders not being ordered. He said ok

## 2013-05-16 NOTE — ED Notes (Signed)
PT states he feels very depressed. Pt denies SI/HI. States he has been drinking etoh and taking oxycodone. Last drink of etoh at 1430. Last dose of oxycodone on Wednesday.

## 2013-05-17 MED ORDER — METHOCARBAMOL 500 MG PO TABS
500.0000 mg | ORAL_TABLET | Freq: Three times a day (TID) | ORAL | Status: DC | PRN
  Administered 2013-05-17 – 2013-05-18 (×4): 500 mg via ORAL
  Filled 2013-05-17 (×4): qty 1

## 2013-05-17 NOTE — BH Assessment (Signed)
Received a phone call at [phone] from Jonny Ruiz who works at [employer]. He called to say that the pt. has been declined at their facility due to high acuity.

## 2013-05-17 NOTE — ED Notes (Signed)
Up to the desk on the phone 

## 2013-05-17 NOTE — ED Notes (Signed)
Psychiatrist and NP into see

## 2013-05-17 NOTE — ED Notes (Signed)
Ronald Robertson from [facility] called to say pt is accepted by Dr Shawnie Dapper but cannot transport until after 9 am tomorrow 05/18/13. Called Disposition tech Marcelino Duster and let her know as well. The number for nurse to nurse report is [phone].

## 2013-05-17 NOTE — Consult Note (Signed)
Ronald Robertson Face-to-Face Psychiatry Consult   Reason for Consult:  Alcohol dependence, Opioid dependence Referring Physician:   EDP Ronald Robertson is an 52 y.o. male.  Assessment: AXIS Robertson:  Substance Induced Mood Disorder and ALCOHOL DEPENDENCE, OPIOID Dependence AXIS II:  Deferred AXIS III:   Past Medical History  Diagnosis Date  . Hypertension   . Depression   . Anxiety    AXIS IV:  occupational problems, other psychosocial or environmental problems and problems related to social environment AXIS V:  51-60 moderate symptoms  Plan:  No evidence of imminent risk to self or others at present.    Subjective:   Ronald Robertson is a 52 y.o. male patient admitted with Alcohol dependence.  HPI: Patient brought self in yesterday  Evening seeking detox treatment from alcohol and Opiates.  Patient was recently discharged from our 300 inpatient unit after detox.  Patient reports he started drinking a week after discharge.  He reports drinking a case of beer a day and took 15 tablets of 5 mg Oxycodone a day.  He reports poor sleep and poor appetite.   He reports his stressors includes the loss of his wife 6 weeks ago and that he is still grieving.  When he was discharged the on the 19 th, he was referred to [facility] outpatient /inpatient rehabilitation facility.  Patient reports he did not keep the appointment.  Patient is asking for another detox and that he plans to go to [facility] from here on discharge.  He denies withdrawal seizures and mild tremor of his fingers are noted.  He is alert and oriented x 4, he is eating and drinking well.  He denies SI/HI/AVH and he does not exhibit any paranoia.  We will monitor him overnight and discharge in am.  We have resumed his blood pressure medications.  HPI Elements:   Location:  [facility]. Quality:  MODERATE. Severity:  MODERATE.Marland Kitchen  Past Psychiatric History: Past Medical History  Diagnosis Date  . Hypertension   . Depression   . Anxiety     reports that he has been  smoking.  He does not have any smokeless tobacco history on file. He reports that he drinks about 126.0 ounces of alcohol per week. He reports that he uses illicit drugs (Marijuana). Family History  Problem Relation Age of Onset  . Adopted: Yes   Family History Substance Abuse: No Family Supports: No Living Arrangements: Alone Can pt return to current living arrangement?: Yes Abuse/Neglect [facility]) Physical Abuse: Denies Verbal Abuse: Denies Sexual Abuse: Denies Allergies:  No Known Allergies  ACT Assessment Complete:  Yes:    Educational Status    Risk to Self: Risk to self Suicidal Ideation: No Suicidal Intent: No Is patient at risk for suicide?: No Suicidal Plan?: No Access to Means: No What has been your use of drugs/alcohol within the last 12 months?: Abusing: alcohol; oxycodone  Previous Attempts/Gestures: No How many times?: 0 Other Self Harm Risks: None  Triggers for Past Attempts: None known Intentional Self Injurious Behavior: None Family Suicide History: No Recent stressful life event(s): Other (Comment);Loss (Comment) (Spouse died 6 weeks ago; Chronic SA) Persecutory voices/beliefs?: No Depression: Yes Depression Symptoms: Insomnia;Tearfulness;Despondent;Fatigue;Loss of interest in usual pleasures;Feeling worthless/self pity;Isolating Substance abuse history and/or treatment for substance abuse?: Yes Suicide prevention information given to non-admitted patients: Not applicable  Risk to Others: Risk to Others Homicidal Ideation: No Thoughts of Harm to Others: No Current Homicidal Intent: No Current Homicidal Plan: No Access to Homicidal Means: No  Identified Victim: None  History of harm to others?: No Assessment of Violence: None Noted Violent Behavior Description: None  Does patient have access to weapons?: No Criminal Charges Pending?: No Does patient have a court date: No  Abuse: Abuse/Neglect Assessment (Assessment to be complete while patient is  alone) Physical Abuse: Denies Verbal Abuse: Denies Sexual Abuse: Denies Exploitation of patient/patient's resources: Denies Self-Neglect: Denies  Prior Inpatient Therapy: Prior Inpatient Therapy Prior Inpatient Therapy: Yes Prior Therapy Dates: 2014,2003,1986 Prior Therapy Facilty/Provider(s): [facility]; [facility]; [facility]  Reason for Treatment: Detox/Rehab/Depression   Prior Outpatient Therapy: Prior Outpatient Therapy Prior Outpatient Therapy: No Prior Therapy Dates: None  Prior Therapy Facilty/Provider(s): None  Reason for Treatment: None   Additional Information: Additional Information 1:1 In Past 12 Months?: No CIRT Risk: No Elopement Risk: No Does patient have medical clearance?: Yes                  Objective: Blood pressure 128/81, pulse 60, temperature 98.8 F (37.1 C), temperature source Oral, resp. rate 20, SpO2 93.00%.There is no weight on file to calculate BMI. Results for orders placed during the hospital encounter of 05/16/13 (from the past 72 hour(s))  URINE RAPID DRUG SCREEN (HOSP PERFORMED)     Status: Abnormal   Collection Time    05/16/13  5:02 PM      Result Value Range   Opiates NONE DETECTED  NONE DETECTED   Cocaine NONE DETECTED  NONE DETECTED   Benzodiazepines NONE DETECTED  NONE DETECTED   Amphetamines NONE DETECTED  NONE DETECTED   Tetrahydrocannabinol POSITIVE (*) NONE DETECTED   Barbiturates NONE DETECTED  NONE DETECTED   Comment:            DRUG SCREEN FOR MEDICAL PURPOSES     ONLY.  IF CONFIRMATION IS NEEDED     FOR ANY PURPOSE, NOTIFY LAB     WITHIN 5 DAYS.                LOWEST DETECTABLE LIMITS     FOR URINE DRUG SCREEN     Drug Class       Cutoff (ng/mL)     Amphetamine      1000     Barbiturate      200     Benzodiazepine   200     Tricyclics       300     Opiates          300     Cocaine          300     THC              50  CBC WITH DIFFERENTIAL     Status: Abnormal   Collection Time    05/16/13   5:20 PM      Result Value Range   WBC 15.9 (*) 4.0 - 10.5 K/uL   RBC 5.51  4.22 - 5.81 MIL/uL   Hemoglobin 18.1 (*) 13.0 - 17.0 g/dL   HCT 09.8  11.9 - 14.7 %   MCV 90.4  78.0 - 100.0 fL   MCH 32.8  26.0 - 34.0 pg   MCHC 36.3 (*) 30.0 - 36.0 g/dL   RDW 82.9  56.2 - 13.0 %   Platelets 396  150 - 400 K/uL   Neutrophils Relative % 75  43 - 77 %   Neutro Abs 12.0 (*) 1.7 - 7.7 K/uL   Lymphocytes Relative 17  12 -  46 %   Lymphs Abs 2.8  0.7 - 4.0 K/uL   Monocytes Relative 6  3 - 12 %   Monocytes Absolute 0.9  0.1 - 1.0 K/uL   Eosinophils Relative 1  0 - 5 %   Eosinophils Absolute 0.2  0.0 - 0.7 K/uL   Basophils Relative 0  0 - 1 %   Basophils Absolute 0.0  0.0 - 0.1 K/uL  COMPREHENSIVE METABOLIC PANEL     Status: Abnormal   Collection Time    05/16/13  5:20 PM      Result Value Range   Sodium 140  135 - 145 mEq/L   Potassium 3.6  3.5 - 5.1 mEq/L   Chloride 103  96 - 112 mEq/L   CO2 24  19 - 32 mEq/L   Glucose, Bld 78  70 - 99 mg/dL   BUN 5 (*) 6 - 23 mg/dL   Creatinine, Ser 1.61  0.50 - 1.35 mg/dL   Calcium 9.9  8.4 - 09.6 mg/dL   Total Protein 7.7  6.0 - 8.3 g/dL   Albumin 3.6  3.5 - 5.2 g/dL   AST 36  0 - 37 U/L   ALT 59 (*) 0 - 53 U/L   Alkaline Phosphatase 76  39 - 117 U/L   Total Bilirubin 0.5  0.3 - 1.2 mg/dL   GFR calc non Af Amer >90  >90 mL/min   GFR calc Af Amer >90  >90 mL/min   Comment: (NOTE)     The eGFR has been calculated using the CKD EPI equation.     This calculation has not been validated in all clinical situations.     eGFR's persistently <90 mL/min signify possible Chronic Kidney     Disease.  ETHANOL     Status: None   Collection Time    05/16/13  5:20 PM      Result Value Range   Alcohol, Ethyl (B) <11  0 - 11 mg/dL   Comment:            LOWEST DETECTABLE LIMIT FOR     SERUM ALCOHOL IS 11 mg/dL     FOR MEDICAL PURPOSES ONLY   Labs are reviewed and are pertinent for Unremarkable, UDS is positive for Marijuana.  Current Facility-Administered  Medications  Medication Dose Route Frequency Provider Last Rate Last Dose  . busPIRone (BUSPAR) tablet 15 mg  15 mg Oral TID Court Joy, PA-C   15 mg at 05/17/13 1019  . gabapentin (NEURONTIN) capsule 300 mg  300 mg Oral QID Court Joy, PA-C   300 mg at 05/17/13 1210  . hydrOXYzine (ATARAX/VISTARIL) tablet 50 mg  50 mg Oral Q6H PRN Court Joy, PA-C   50 mg at 05/17/13 1202  . methocarbamol (ROBAXIN) tablet 500 mg  500 mg Oral Q8H PRN Earney Navy, NP   500 mg at 05/17/13 1021  . metoprolol tartrate (LOPRESSOR) tablet 50 mg  50 mg Oral BID Court Joy, PA-C   50 mg at 05/17/13 1022  . nicotine (NICODERM CQ - dosed in mg/24 hours) patch 21 mg  21 mg Transdermal Daily Court Joy, PA-C   21 mg at 05/17/13 1017  . sertraline (ZOLOFT) tablet 100 mg  100 mg Oral Daily Court Joy, PA-C   100 mg at 05/17/13 1020  . traZODone (DESYREL) tablet 50 mg  50 mg Oral QHS Court Joy, PA-C   50 mg at 05/16/13 2348  Current Outpatient Prescriptions  Medication Sig Dispense Refill  . busPIRone (BUSPAR) 15 MG tablet Take 1 tablet (15 mg total) by mouth 3 (three) times daily.  90 tablet  0  . gabapentin (NEURONTIN) 300 MG capsule Take 1 capsule (300 mg total) by mouth 4 (four) times daily.  120 capsule  0  . hydrOXYzine (ATARAX/VISTARIL) 50 MG tablet Take 50 mg by mouth 3 (three) times daily.      . methocarbamol (ROBAXIN) 500 MG tablet Take 1 tablet (500 mg total) by mouth every 8 (eight) hours.  90 tablet  0  . metoprolol (LOPRESSOR) 50 MG tablet Take 1 tablet (50 mg total) by mouth 2 (two) times daily.  60 tablet  0  . sertraline (ZOLOFT) 100 MG tablet Take 1 tablet (100 mg total) by mouth daily. For anxiety and depression.  30 tablet  0  . zolpidem (AMBIEN) 10 MG tablet Take 1 tablet (10 mg total) by mouth at bedtime as needed for sleep.  7 tablet  0    Psychiatric Specialty Exam:     Blood pressure 128/81, pulse 60, temperature 98.8 F (37.1 C), temperature source  Oral, resp. rate 20, SpO2 93.00%.There is no weight on file to calculate BMI.  General Appearance: Casual  Eye Contact::  Good  Speech:  Clear and Coherent and Normal Rate  Volume:  Normal  Mood:  Anxious, Depressed and Hopeless  Affect:  Congruent, Depressed and Flat  Thought Process:  Coherent and Intact  Orientation:  Full (Time, Place, and Person)  Thought Content:  NA  Suicidal Thoughts:  No  Homicidal Thoughts:  No  Memory:  Immediate;   Good Recent;   Good Remote;   Good  Judgement:  Poor  Insight:  Lacking and Shallow  Psychomotor Activity:  Normal  Concentration:  Good  Recall:  NA  Akathisia:  NA  Handed:  Right  AIMS (if indicated):     Assets:  Desire for Improvement  Sleep:      Treatment Plan Summary: Consult and face to face interview with Dr Tawni Carnes We will observe patient overnight for S/S of withdrawal and treat We will reevaluate in am and discharge.   Daily contact with patient to assess and evaluate symptoms and progress in treatment Medication management  Dahlia Byes, C  PMHNP-BC 05/17/2013 2:40 PM  Pt was seen with NP. Agree with above assessment and plan.  Ancil Linsey, MD

## 2013-05-17 NOTE — Progress Notes (Signed)
[facility]: Spoke with Onalee Hua @1800  Dr will review paper work tomorrow currently no beds available.  Soleia Badolato Cathey,MHT

## 2013-05-17 NOTE — Progress Notes (Signed)
Underwriter initiated bed placement on behalf of pt.  The following hospitals were faxed with bed availability: 1)[facility] 3)[facility]  Blain Pais, MHT/NS

## 2013-05-17 NOTE — Progress Notes (Addendum)
[facility]: [phone] Faxed referral called to confirm receipt, Elnita Maxwell stated admissions closed but will be reviewed in the morning after 8am. [facility]: [phone] Beds Available Referral Faxed [facility]: 0230 Beds Available Referral Faxed  Deatra James, MHT

## 2013-05-17 NOTE — ED Notes (Signed)
Up to the bathroom 

## 2013-05-18 ENCOUNTER — Encounter (HOSPITAL_COMMUNITY): Payer: Self-pay | Admitting: Registered Nurse

## 2013-05-18 DIAGNOSIS — F1994 Other psychoactive substance use, unspecified with psychoactive substance-induced mood disorder: Secondary | ICD-10-CM

## 2013-05-18 DIAGNOSIS — F1020 Alcohol dependence, uncomplicated: Secondary | ICD-10-CM

## 2013-05-18 DIAGNOSIS — F1120 Opioid dependence, uncomplicated: Secondary | ICD-10-CM

## 2013-05-18 NOTE — ED Notes (Signed)
Pt showered and changed scrubs. 

## 2013-05-18 NOTE — ED Notes (Signed)
TTS talking w/ pt 

## 2013-05-18 NOTE — ED Notes (Signed)
Up to the desk on the phone 

## 2013-05-18 NOTE — ED Notes (Signed)
Patient alert and oriented. Patient denies SI and HI. Patient stated that he is spending the night with a friend and going to [place] on Monday.  Discharge instructions reviewed with patient. Patient verbalized understanding. Mobile crisis information given. Patient's belongings given after leaving unit with nurse tech. Patient ambulatory to D/C window.

## 2013-05-18 NOTE — Consult Note (Signed)
Follow up [facility] Face-to-Face Psychiatry Consult   Reason for Consult:  Alcohol dependence, Opioid dependence Referring Physician:   EDP Ronald Robertson is an 52 y.o. male.  Assessment: AXIS I:  Substance Induced Mood Disorder and ALCOHOL DEPENDENCE, OPIOID Dependence AXIS II:  Deferred AXIS III:   Past Medical History  Diagnosis Date  . Hypertension   . Depression   . Anxiety    AXIS IV:  occupational problems, other psychosocial or environmental problems and problems related to social environment AXIS V:  51-60 moderate symptoms  Plan:  No evidence of imminent risk to self or others at present.   Patient does not meet criteria for psychiatric inpatient admission. Supportive therapy provided about ongoing stressors. Discussed crisis plan, support from social network, calling 911, coming to the Emergency Department, and calling Suicide Hotline.  Subjective:   Ronald Robertson is a 52 y.o. male patient admitted with Alcohol dependence.  Patient states that he is feeling better today. Patient denies suicidal/homicidal ideation, psychosis, and paranoia.  "I really don't want to go to [facility] inpatient it is to far and I have been accepted to [facility] and I am suppose to do my phone interview on Monday.  I just want to stay some where until I can leave and go to [facility]."  In formed patient that we could not pick and chose treatment facilities.  [place] and [address] only 30 minutes distance one [place] and the other [place].  "I just want to stay here.  I don't want to go or I will wait until there is a bed at [facility]."  Also informed patient that he really did not  Have any criteria for inpatient treatment and that we could not just hold him in the hospital if he was not suicidal , homicidal, psychotic or paranoid.  Informed patient that if he wanted inpatient treatment for his depression he would have to take the bed at [facility] or he could follow up with out patient services. Patient chose  to be discharge home stating that he would call [facility] form home.  Patient   HPI Elements:   Location:  [facility]. Quality:  MODERATE. Severity:  MODERATE.Marland Kitchen  Past Psychiatric History: Past Medical History  Diagnosis Date  . Hypertension   . Depression   . Anxiety     reports that he has been smoking.  He does not have any smokeless tobacco history on file. He reports that he drinks about 126.0 ounces of alcohol per week. He reports that he uses illicit drugs (Marijuana). Family History  Problem Relation Age of Onset  . Adopted: Yes   Family History Substance Abuse: No Family Supports: No Living Arrangements: Alone Can pt return to current living arrangement?: Yes Abuse/Neglect [facility]) Physical Abuse: Denies Verbal Abuse: Denies Sexual Abuse: Denies Allergies:  No Known Allergies  ACT Assessment Complete:  Yes:    Educational Status    Risk to Self: Risk to self Suicidal Ideation: No Suicidal Intent: No Is patient at risk for suicide?: No Suicidal Plan?: No Access to Means: No What has been your use of drugs/alcohol within the last 12 months?: Abusing: alcohol; oxycodone  Previous Attempts/Gestures: No How many times?: 0 Other Self Harm Risks: None  Triggers for Past Attempts: None known Intentional Self Injurious Behavior: None Family Suicide History: No Recent stressful life event(s): Other (Comment);Loss (Comment) (Spouse died 6 weeks ago; Chronic SA) Persecutory voices/beliefs?: No Depression: Yes Depression Symptoms: Insomnia;Tearfulness;Despondent;Fatigue;Loss of interest in usual pleasures;Feeling worthless/self  pity;Isolating Substance abuse history and/or treatment for substance abuse?: Yes Suicide prevention information given to non-admitted patients: Not applicable  Risk to Others: Risk to Others Homicidal Ideation: No Thoughts of Harm to Others: No Current Homicidal Intent: No Current Homicidal Plan: No Access to Homicidal Means: No Identified Victim: None   History of harm to others?: No Assessment of Violence: None Noted Violent Behavior Description: None  Does patient have access to weapons?: No Criminal Charges Pending?: No Does patient have a court date: No  Abuse: Abuse/Neglect Assessment (Assessment to be complete while patient is alone) Physical Abuse: Denies Verbal Abuse: Denies Sexual Abuse: Denies Exploitation of patient/patient's resources: Denies Self-Neglect: Denies  Prior Inpatient Therapy: Prior Inpatient Therapy Prior Inpatient Therapy: Yes Prior Therapy Dates: 2014,2003,1986 Prior Therapy Facilty/Provider(s): [facility]; [facility]; [facility]  Reason for Treatment: Detox/Rehab/Depression   Prior Outpatient Therapy: Prior Outpatient Therapy Prior Outpatient Therapy: No Prior Therapy Dates: None  Prior Therapy Facilty/Provider(s): None  Reason for Treatment: None   Additional Information: Additional Information 1:1 In Past 12 Months?: No CIRT Risk: No Elopement Risk: No Does patient have medical clearance?: Yes                  Objective: Blood pressure 136/88, pulse 59, temperature 98.1 F (36.7 C), temperature source Oral, resp. rate 24, SpO2 94.00%.There is no weight on file to calculate BMI. Results for orders placed during the hospital encounter of 05/16/13 (from the past 72 hour(s))  URINE RAPID DRUG SCREEN (HOSP PERFORMED)     Status: Abnormal   Collection Time    05/16/13  5:02 PM      Result Value Range   Opiates NONE DETECTED  NONE DETECTED   Cocaine NONE DETECTED  NONE DETECTED   Benzodiazepines NONE DETECTED  NONE DETECTED   Amphetamines NONE DETECTED  NONE DETECTED   Tetrahydrocannabinol POSITIVE (*) NONE DETECTED   Barbiturates NONE DETECTED  NONE DETECTED   Comment:            DRUG SCREEN FOR MEDICAL PURPOSES     ONLY.  IF CONFIRMATION IS NEEDED     FOR ANY PURPOSE, NOTIFY LAB     WITHIN 5 DAYS.                LOWEST DETECTABLE LIMITS     FOR URINE DRUG SCREEN      Drug Class       Cutoff (ng/mL)     Amphetamine      1000     Barbiturate      200     Benzodiazepine   200     Tricyclics       300     Opiates          300     Cocaine          300     THC              50  CBC WITH DIFFERENTIAL     Status: Abnormal   Collection Time    05/16/13  5:20 PM      Result Value Range   WBC 15.9 (*) 4.0 - 10.5 K/uL   RBC 5.51  4.22 - 5.81 MIL/uL   Hemoglobin 18.1 (*) 13.0 - 17.0 g/dL   HCT 47.8  29.5 - 62.1 %   MCV 90.4  78.0 - 100.0 fL   MCH 32.8  26.0 - 34.0 pg   MCHC 36.3 (*) 30.0 - 36.0  g/dL   RDW 16.1  09.6 - 04.5 %   Platelets 396  150 - 400 K/uL   Neutrophils Relative % 75  43 - 77 %   Neutro Abs 12.0 (*) 1.7 - 7.7 K/uL   Lymphocytes Relative 17  12 - 46 %   Lymphs Abs 2.8  0.7 - 4.0 K/uL   Monocytes Relative 6  3 - 12 %   Monocytes Absolute 0.9  0.1 - 1.0 K/uL   Eosinophils Relative 1  0 - 5 %   Eosinophils Absolute 0.2  0.0 - 0.7 K/uL   Basophils Relative 0  0 - 1 %   Basophils Absolute 0.0  0.0 - 0.1 K/uL  COMPREHENSIVE METABOLIC PANEL     Status: Abnormal   Collection Time    05/16/13  5:20 PM      Result Value Range   Sodium 140  135 - 145 mEq/L   Potassium 3.6  3.5 - 5.1 mEq/L   Chloride 103  96 - 112 mEq/L   CO2 24  19 - 32 mEq/L   Glucose, Bld 78  70 - 99 mg/dL   BUN 5 (*) 6 - 23 mg/dL   Creatinine, Ser 4.09  0.50 - 1.35 mg/dL   Calcium 9.9  8.4 - 81.1 mg/dL   Total Protein 7.7  6.0 - 8.3 g/dL   Albumin 3.6  3.5 - 5.2 g/dL   AST 36  0 - 37 U/L   ALT 59 (*) 0 - 53 U/L   Alkaline Phosphatase 76  39 - 117 U/L   Total Bilirubin 0.5  0.3 - 1.2 mg/dL   GFR calc non Af Amer >90  >90 mL/min   GFR calc Af Amer >90  >90 mL/min   Comment: (NOTE)     The eGFR has been calculated using the CKD EPI equation.     This calculation has not been validated in all clinical situations.     eGFR's persistently <90 mL/min signify possible Chronic Kidney     Disease.  ETHANOL     Status: None   Collection Time    05/16/13  5:20 PM       Result Value Range   Alcohol, Ethyl (B) <11  0 - 11 mg/dL   Comment:            LOWEST DETECTABLE LIMIT FOR     SERUM ALCOHOL IS 11 mg/dL     FOR MEDICAL PURPOSES ONLY   Labs are reviewed and are pertinent for Unremarkable, UDS is positive for Marijuana.  Current Facility-Administered Medications  Medication Dose Route Frequency Provider Last Rate Last Dose  . busPIRone (BUSPAR) tablet 15 mg  15 mg Oral TID Court Joy, PA-C   15 mg at 05/18/13 1024  . gabapentin (NEURONTIN) capsule 300 mg  300 mg Oral QID Court Joy, PA-C   300 mg at 05/18/13 1200  . hydrOXYzine (ATARAX/VISTARIL) tablet 50 mg  50 mg Oral Q6H PRN Court Joy, PA-C   50 mg at 05/18/13 0930  . methocarbamol (ROBAXIN) tablet 500 mg  500 mg Oral Q8H PRN Earney Navy, NP   500 mg at 05/18/13 1058  . metoprolol tartrate (LOPRESSOR) tablet 50 mg  50 mg Oral BID Court Joy, PA-C   50 mg at 05/18/13 1024  . nicotine (NICODERM CQ - dosed in mg/24 hours) patch 21 mg  21 mg Transdermal Daily Court Joy, PA-C   21 mg at 05/18/13 1032  .  sertraline (ZOLOFT) tablet 100 mg  100 mg Oral Daily Court Joy, PA-C   100 mg at 05/18/13 1024  . traZODone (DESYREL) tablet 50 mg  50 mg Oral QHS Court Joy, PA-C   50 mg at 05/17/13 2324   Current Outpatient Prescriptions  Medication Sig Dispense Refill  . busPIRone (BUSPAR) 15 MG tablet Take 1 tablet (15 mg total) by mouth 3 (three) times daily.  90 tablet  0  . gabapentin (NEURONTIN) 300 MG capsule Take 1 capsule (300 mg total) by mouth 4 (four) times daily.  120 capsule  0  . hydrOXYzine (ATARAX/VISTARIL) 50 MG tablet Take 50 mg by mouth 3 (three) times daily.      . methocarbamol (ROBAXIN) 500 MG tablet Take 1 tablet (500 mg total) by mouth every 8 (eight) hours.  90 tablet  0  . metoprolol (LOPRESSOR) 50 MG tablet Take 1 tablet (50 mg total) by mouth 2 (two) times daily.  60 tablet  0  . sertraline (ZOLOFT) 100 MG tablet Take 1 tablet (100 mg total) by  mouth daily. For anxiety and depression.  30 tablet  0  . zolpidem (AMBIEN) 10 MG tablet Take 1 tablet (10 mg total) by mouth at bedtime as needed for sleep.  7 tablet  0    Psychiatric Specialty Exam:     Blood pressure 136/88, pulse 59, temperature 98.1 F (36.7 C), temperature source Oral, resp. rate 24, SpO2 94.00%.There is no weight on file to calculate BMI.  General Appearance: Casual  Eye Contact::  Good  Speech:  Clear and Coherent and Normal Rate  Volume:  Normal  Mood:  Anxious, Depressed and Hopeless  Affect:  Congruent and Depressed  Thought Process:  Coherent and Intact  Orientation:  Full (Time, Place, and Person)  Thought Content:  NA  Suicidal Thoughts:  No  Homicidal Thoughts:  No  Memory:  Immediate;   Good Recent;   Good Remote;   Good  Judgement:  Poor  Insight:  Shallow  Psychomotor Activity:  Normal  Concentration:  Good  Recall:  NA  Akathisia:  NA  Handed:  Right  AIMS (if indicated):     Assets:  Desire for Improvement  Sleep:      Face to face interview and consult with  Dr. Tawni Carnes  Treatment Plan Summary: Out patient resources  Disposition:  Discharge home to follow up with outpatient services.  Patient will call [facility] tomorrow (05/19/13).  Assunta Found  FNP-BC  05/18/2013 1:37 PM  Pt was seen with FNP. Agree with above assessment and plan. Will d/c to home, and pt agrees to f/u at [facility] tomorrow.  Ancil Linsey, MD

## 2013-05-18 NOTE — ED Notes (Signed)
edp into see 

## 2013-05-18 NOTE — ED Notes (Signed)
Friend in w/ pt 

## 2013-05-18 NOTE — ED Notes (Signed)
Pt has decided that he does not want to go to [place] [facility] and is going to go home nad contact [place].

## 2013-05-18 NOTE — ED Notes (Signed)
Pt sitting quietly, Pt is aware that he has been accepted to [facility] but he does not want to go there because it is too far to go.  Will inform psych MD

## 2013-05-18 NOTE — ED Provider Notes (Signed)
12:22 PM Psych has cleared patient for discharge. He denies any SI/HI currently. Will follow up as arranged by psychiatry.  Audree Camel, MD 05/18/13 [phone]

## 2013-05-18 NOTE — ED Notes (Addendum)
Up walking in the hall 

## 2013-05-18 NOTE — Progress Notes (Signed)
Per RN request, CSW provided RN with crisis line resources for RN to give pt.  [place] Spaniel [place], [phone]     ED CSW  1:05pm

## 2013-05-18 NOTE — Consult Note (Signed)
Spoke to [place] at [phone] at [place] and she confirmed TTS would have to call back at 0800 am on Monday to discuss if pt is on accepted list.    Info conveyed to psychiatry.  Pt does not want to go to [facility].  Pt reports it is too far away and he won't have visitors or supports.  This makes sense to some degree but [place] Harry is in [place] which is about 10 miles from [facility] and the same issue will be present when pt goes there.  Pt was polite and cooperative and appears highly anxious.  Briefed psychiatry on issue and they will round on him
